# Patient Record
Sex: Male | Born: 1960 | Race: White | Hispanic: No | Marital: Married | Smoking: Former smoker
Health system: Southern US, Community
[De-identification: ages and names within clinical notes are randomized; demographics above are authoritative.]

## PROBLEM LIST (undated history)

## (undated) DIAGNOSIS — J45909 Unspecified asthma, uncomplicated: Secondary | ICD-10-CM

---

## 2015-03-18 ENCOUNTER — Emergency Department (HOSPITAL_COMMUNITY): Payer: 59

## 2015-03-18 ENCOUNTER — Emergency Department (HOSPITAL_COMMUNITY)
Admission: EM | Admit: 2015-03-18 | Discharge: 2015-03-18 | Disposition: A | Discharge status: Home / Self Care | Payer: 59 | Attending: Emergency Medicine | Admitting: Emergency Medicine

## 2015-03-18 ENCOUNTER — Encounter (HOSPITAL_COMMUNITY): Payer: Self-pay

## 2015-03-18 DIAGNOSIS — R339 Retention of urine, unspecified: Secondary | ICD-10-CM | POA: Insufficient documentation

## 2015-03-18 DIAGNOSIS — M545 Low back pain: Secondary | ICD-10-CM | POA: Diagnosis not present

## 2015-03-18 DIAGNOSIS — J45909 Unspecified asthma, uncomplicated: Secondary | ICD-10-CM | POA: Diagnosis not present

## 2015-03-18 DIAGNOSIS — Z792 Long term (current) use of antibiotics: Secondary | ICD-10-CM | POA: Diagnosis not present

## 2015-03-18 DIAGNOSIS — Z87891 Personal history of nicotine dependence: Secondary | ICD-10-CM | POA: Diagnosis not present

## 2015-03-18 DIAGNOSIS — Z7951 Long term (current) use of inhaled steroids: Secondary | ICD-10-CM | POA: Diagnosis not present

## 2015-03-18 DIAGNOSIS — Z79899 Other long term (current) drug therapy: Secondary | ICD-10-CM | POA: Diagnosis not present

## 2015-03-18 DIAGNOSIS — R300 Dysuria: Secondary | ICD-10-CM | POA: Diagnosis present

## 2015-03-18 DIAGNOSIS — Z791 Long term (current) use of non-steroidal anti-inflammatories (NSAID): Secondary | ICD-10-CM | POA: Diagnosis not present

## 2015-03-18 HISTORY — DX: Unspecified asthma, uncomplicated: J45.909

## 2015-03-18 LAB — CBC WITH DIFFERENTIAL/PLATELET
Basophils Absolute: 0 10*3/uL (ref 0.0–0.1)
Basophils Relative: 0 % (ref 0–1)
Eosinophils Absolute: 0 10*3/uL (ref 0.0–0.7)
Eosinophils Relative: 0 % (ref 0–5)
HEMATOCRIT: 45.8 % (ref 39.0–52.0)
HEMOGLOBIN: 15.8 g/dL (ref 13.0–17.0)
LYMPHS PCT: 6 % — AB (ref 12–46)
Lymphs Abs: 0.5 10*3/uL — ABNORMAL LOW (ref 0.7–4.0)
MCH: 30.3 pg (ref 26.0–34.0)
MCHC: 34.5 g/dL (ref 30.0–36.0)
MCV: 87.9 fL (ref 78.0–100.0)
MONO ABS: 0.4 10*3/uL (ref 0.1–1.0)
MONOS PCT: 5 % (ref 3–12)
NEUTROS ABS: 6.4 10*3/uL (ref 1.7–7.7)
Neutrophils Relative %: 89 % — ABNORMAL HIGH (ref 43–77)
Platelets: 126 10*3/uL — ABNORMAL LOW (ref 150–400)
RBC: 5.21 MIL/uL (ref 4.22–5.81)
RDW: 13 % (ref 11.5–15.5)
WBC: 7.2 10*3/uL (ref 4.0–10.5)

## 2015-03-18 LAB — COMPREHENSIVE METABOLIC PANEL
ALBUMIN: 4 g/dL (ref 3.5–5.2)
ALT: 21 U/L (ref 0–53)
AST: 27 U/L (ref 0–37)
Alkaline Phosphatase: 47 U/L (ref 39–117)
Anion gap: 9 (ref 5–15)
BUN: 9 mg/dL (ref 6–23)
CO2: 27 mmol/L (ref 19–32)
Calcium: 9 mg/dL (ref 8.4–10.5)
Chloride: 102 mmol/L (ref 96–112)
Creatinine, Ser: 1.14 mg/dL (ref 0.50–1.35)
GFR calc Af Amer: 83 mL/min — ABNORMAL LOW (ref >90)
GFR, EST NON AFRICAN AMERICAN: 72 mL/min — AB (ref >90)
Glucose, Bld: 107 mg/dL — ABNORMAL HIGH (ref 70–99)
Potassium: 4.3 mmol/L (ref 3.5–5.1)
SODIUM: 138 mmol/L (ref 135–145)
TOTAL PROTEIN: 7.2 g/dL (ref 6.0–8.3)
Total Bilirubin: 1.1 mg/dL (ref 0.3–1.2)

## 2015-03-18 LAB — URINALYSIS, ROUTINE W REFLEX MICROSCOPIC
BILIRUBIN URINE: NEGATIVE
GLUCOSE, UA: NEGATIVE mg/dL
Hgb urine dipstick: NEGATIVE
Ketones, ur: NEGATIVE mg/dL
Leukocytes, UA: NEGATIVE
Nitrite: NEGATIVE
Protein, ur: NEGATIVE mg/dL
Specific Gravity, Urine: 1.006 (ref 1.005–1.030)
UROBILINOGEN UA: 1 mg/dL (ref 0.0–1.0)
pH: 6.5 (ref 5.0–8.0)

## 2015-03-18 LAB — TROPONIN I: Troponin I: <0.03 ng/mL (ref <0.031)

## 2015-03-18 MED ORDER — MORPHINE SULFATE 4 MG/ML IJ SOLN
4.0000 mg | Freq: Once | INTRAMUSCULAR | Status: AC
  Administered 2015-03-18: 4 mg via INTRAVENOUS
  Filled 2015-03-18: qty 1

## 2015-03-18 MED ORDER — ONDANSETRON HCL 4 MG/2ML IJ SOLN
4.0000 mg | Freq: Once | INTRAMUSCULAR | Status: AC
  Administered 2015-03-18: 4 mg via INTRAVENOUS
  Filled 2015-03-18: qty 2

## 2015-03-18 NOTE — ED Notes (Signed)
Staff attempted to insert a 16Fr foley catheter and unsuccessful. EDP notified. Coude at bedside for EDP to possible attempt.

## 2015-03-18 NOTE — ED Provider Notes (Signed)
CSN: [id number]     Arrival date & time 03/18/15  1345 History   First MD Initiated Contact with Patient 03/18/15 1644     Chief Complaint  Patient presents with  . Dysuria     (Consider location/radiation/quality/duration/timing/severity/associated sxs/prior Treatment) HPI Comments: Patient reports pain and pressure with urination and difficulty obtaining his bladder since this morning. Also endorses pain across his low back for the past several days. He saw his PCP yesterday for body aches, chills and fever and was started on azithromycin for pneumonia. A chest x-ray was not done. He states he has been coughing that is improving. He does have a history of asthma. Denies Chest pain or shortness of breath. Denies any change in bowel habits. He denies any history of prostate problems or kidney stones. He denies any blood in his urine. He denies any fever today but had a fever yesterday.  The history is provided by the patient.    Past Medical History  Diagnosis Date  . Asthma    History reviewed. No pertinent past surgical history. History reviewed. No pertinent family history. History  Substance Use Topics  . Smoking status: Former Games developer  . Smokeless tobacco: Not on file  . Alcohol Use: No    Review of Systems  Constitutional: Negative for fever, activity change and appetite change.  HENT: Negative for congestion and rhinorrhea.   Respiratory: Negative for cough, chest tightness and shortness of breath.   Cardiovascular: Negative for chest pain.  Gastrointestinal: Positive for abdominal pain. Negative for nausea and vomiting.  Genitourinary: Positive for dysuria, urgency, flank pain, decreased urine volume and difficulty urinating.  Musculoskeletal: Positive for back pain.  Skin: Negative for pallor.  Neurological: Negative for dizziness, light-headedness, numbness and headaches.  A complete 10 system review of systems was obtained and all systems are negative except as noted  in the HPI and PMH.      Allergies  Review of patient's allergies indicates no known allergies.  Home Medications   Prior to Admission medications   Medication Sig Start Date End Date Taking? Authorizing Provider  azithromycin (ZITHROMAX) 250 MG tablet Take 250-500 mg by mouth daily. Pt to takes 500 mg on day 1 then 250 mg for 4 days after.  Started on 03-17-15   Yes Historical Provider, MD  beclomethasone (QVAR) 40 MCG/ACT inhaler Inhale 1 puff into the lungs 2 (two) times daily.   Yes Historical Provider, MD  celecoxib (CELEBREX) 200 MG capsule Take 200 mg by mouth daily.   Yes Historical Provider, MD  HYDROcodone-homatropine (HYCODAN) 5-1.5 MG/5ML syrup Take 5 mLs by mouth every 6 (six) hours as needed for cough.   Yes Historical Provider, MD  ipratropium-albuterol (DUONEB) 0.5-2.5 (3) MG/3ML SOLN Take 3 mLs by nebulization every 4 (four) hours as needed (wheezing).   Yes Historical Provider, MD  pantoprazole (PROTONIX) 40 MG tablet Take 40 mg by mouth daily.   Yes Historical Provider, MD  predniSONE (DELTASONE) 20 MG tablet Take 20 mg by mouth See admin instructions. Take 1 tablet twice daily for 5 days. Then 1 tablet daily for 5 days then off. Started on 03-17-15   Yes Historical Provider, MD   BP 114/68 mmHg  Pulse 73  Temp(Src) 98 F (36.7 C) (Oral)  Resp 18  Ht  (1.676 m)  Wt 135 lb (61.236 kg)  BMI 21.80 kg/m2  SpO2 99% Physical Exam  Constitutional: He is oriented to person, place, and time. He appears well-developed and well-nourished. No distress.  HENT:  Head: Normocephalic and atraumatic.  Mouth/Throat: Oropharynx is clear and moist. No oropharyngeal exudate.  Eyes: Conjunctivae and EOM are normal. Pupils are equal, round, and reactive to light.  Neck: Normal range of motion. Neck supple.  No meningismus.  Cardiovascular: Normal rate, regular rhythm, normal heart sounds and intact distal pulses.   No murmur heard. Pulmonary/Chest: Effort normal and breath  sounds normal. No respiratory distress.  Abdominal: Soft. He exhibits distension. There is tenderness. There is no rebound and no guarding.  Distended abdomen with palpable bladder  Musculoskeletal: Normal range of motion. He exhibits tenderness. He exhibits no edema.  Paraspinal lumbar tenderness  Neurological: He is alert and oriented to person, place, and time. No cranial nerve deficit. He exhibits normal muscle tone. Coordination normal.  No ataxia on finger to nose bilaterally. No pronator drift. 5/5 strength throughout. CN 2-12 intact. Negative Romberg. Equal grip strength. Sensation intact. Gait is normal.   Skin: Skin is warm.  Psychiatric: He has a normal mood and affect. His behavior is normal.  Nursing note and vitals reviewed.   ED Course  BLADDER CATHETERIZATION Date/Time: 03/18/2015 6:44 PM Performed by: Glynn Octave Authorized by: Glynn Octave Consent: Verbal consent obtained. Risks and benefits: risks, benefits and alternatives were discussed Consent given by: patient Patient understanding: patient states understanding of the procedure being performed Patient consent: the patient's understanding of the procedure matches consent given Procedure consent: procedure consent matches procedure scheduled Relevant documents: relevant documents present and verified Patient identity confirmed: verbally with patient and provided demographic data Indications: urinary retention Local anesthesia used: yes Local anesthetic: topical anesthetic Patient sedated: no Preparation: Patient was prepped and draped in the usual sterile fashion. Catheter insertion: indwelling Catheter type: Foley Catheter size: 14 Fr Complicated insertion: no Altered anatomy: no Bladder irrigation: no Number of attempts: 1 Urine characteristics: clear Patient tolerance: Patient tolerated the procedure well with no immediate complications   (including critical care time) Labs Review Labs  Reviewed  CBC WITH DIFFERENTIAL/PLATELET - Abnormal; Notable for the following:    Platelets 126 (*)    Neutrophils Relative % 89 (*)    Lymphocytes Relative 6 (*)    Lymphs Abs 0.5 (*)    All other components within normal limits  COMPREHENSIVE METABOLIC PANEL - Abnormal; Notable for the following:    Glucose, Bld 107 (*)    GFR calc non Af Amer 72 (*)    GFR calc Af Amer 83 (*)    All other components within normal limits  URINALYSIS, ROUTINE W REFLEX MICROSCOPIC  TROPONIN I    Imaging Review Dg Chest 2 View  03/18/2015   CLINICAL DATA:  54 year old male new with shortness of breath, fever and this urea  EXAM: CHEST  2 VIEW  COMPARISON:  None.  FINDINGS: The lungs are clear and negative for focal airspace consolidation, pulmonary edema or suspicious pulmonary nodule. No pleural effusion or pneumothorax. Cardiac and mediastinal contours are within normal limits. No acute fracture or lytic or blastic osseous lesions. The visualized upper abdominal bowel gas pattern is unremarkable.  IMPRESSION: No active cardiopulmonary disease.   Electronically Signed   By: Malachy Moan M.D.   On: 03/18/2015 20:12     EKG Interpretation   Date/Time:  Wednesday March 18 2015 17:53:10 EDT Ventricular Rate:  83 PR Interval:  170 QRS Duration: 79 QT Interval:  360 QTC Calculation: 423 R Axis:   85 Text Interpretation:  Sinus rhythm Consider left atrial enlargement No  previous ECGs  available Confirmed by Manus GunningANCOUR  MD, Gervase Colberg [phone]([id number]) on  03/18/2015 6:11:10 PM      MDM   Final diagnoses:  Urinary retention   patient with difficulty urinating, lower abdominal pain and back pain. Diagnosed with pneumonia yesterday and started on Zithromax. No chest pain or shortness of breath.  Bladder distended on exam. Greater than 800 mL on bladder scan. Foley catheter will be placed.  Foley catheter placed with drainage of clear urine. Creatinine normal. Urinalysis negative.  Foley catheter will remain  in place. Follow-up with urology. No medicines on patient's list that may cause urinary retention. No evidence of pneumonia on chest x-ray.  Patient to follow-up with urology. Foley catheter will remain in place. Continue medications as prescribed by PCP. Return precautions discussed.  Glynn OctaveStephen Maurya Nethery, MD 03/18/15 [id number]

## 2015-03-18 NOTE — ED Notes (Signed)
414fr Urinary catheter inserted by EDP.

## 2015-03-18 NOTE — ED Notes (Signed)
Changed foley bag to leg bag. Secured the foley tube with a leg strap.

## 2015-03-18 NOTE — Discharge Instructions (Signed)
Acute Urinary Retention Followup with the urologist this week. Return to the ED if you develop new or worsening symptoms. Acute urinary retention is the temporary inability to urinate. This is a common problem in older men. As men age their prostates become larger and block the flow of urine from the bladder. This is usually a problem that has come on gradually.  HOME CARE INSTRUCTIONS If you are sent home with a Foley catheter and a drainage system, you will need to discuss the best course of action with your health care provider. While the catheter is in, maintain a good intake of fluids. Keep the drainage bag emptied and lower than your catheter. This is so that contaminated urine will not flow back into your bladder, which could lead to a urinary tract infection. There are two main types of drainage bags. One is a large bag that usually is used at night. It has a good capacity that will allow you to sleep through the night without having to empty it. The second type is called a leg bag. It has a smaller capacity, so it needs to be emptied more frequently. However, the main advantage is that it can be attached by a leg strap and can go underneath your clothing, allowing you the freedom to move about or leave your home. Only take over-the-counter or prescription medicines for pain, discomfort, or fever as directed by your health care provider.  SEEK MEDICAL CARE IF:  You develop a low-grade fever.  You experience spasms or leakage of urine with the spasms. SEEK IMMEDIATE MEDICAL CARE IF:   You develop chills or fever.  Your catheter stops draining urine.  Your catheter falls out.  You start to develop increased bleeding that does not respond to rest and increased fluid intake. MAKE SURE YOU:  Understand these instructions.  Will watch your condition.  Will get help right away if you are not doing well or get worse. Document Released: 02/20/2001 Document Revised: 11/19/2013 Document  Reviewed: 04/25/2013 [facility] Patient Information 2015 [employer] [place]. This information is not intended to replace advice given to you by your health care provider. Make sure you discuss any questions you have with your health care provider.

## 2015-03-18 NOTE — ED Notes (Signed)
Pt presents with dysuria x 2-3 days with "kidney pain" "for a while".  Pt also reports suprapubic pain.  Pt seen at PCP yesterday, was diagnosed with pneumonia, was told he did not have a kidney infection from urinalysis.

## 2016-03-27 IMAGING — CR DG CHEST 2V
2 series · 2 of 2 positions shown · non-contrast
Comparison: None.

CLINICAL DATA: 54-year-old male new with shortness of breath, fever
and this urea

EXAM:
CHEST  2 VIEW

[w chest lat]
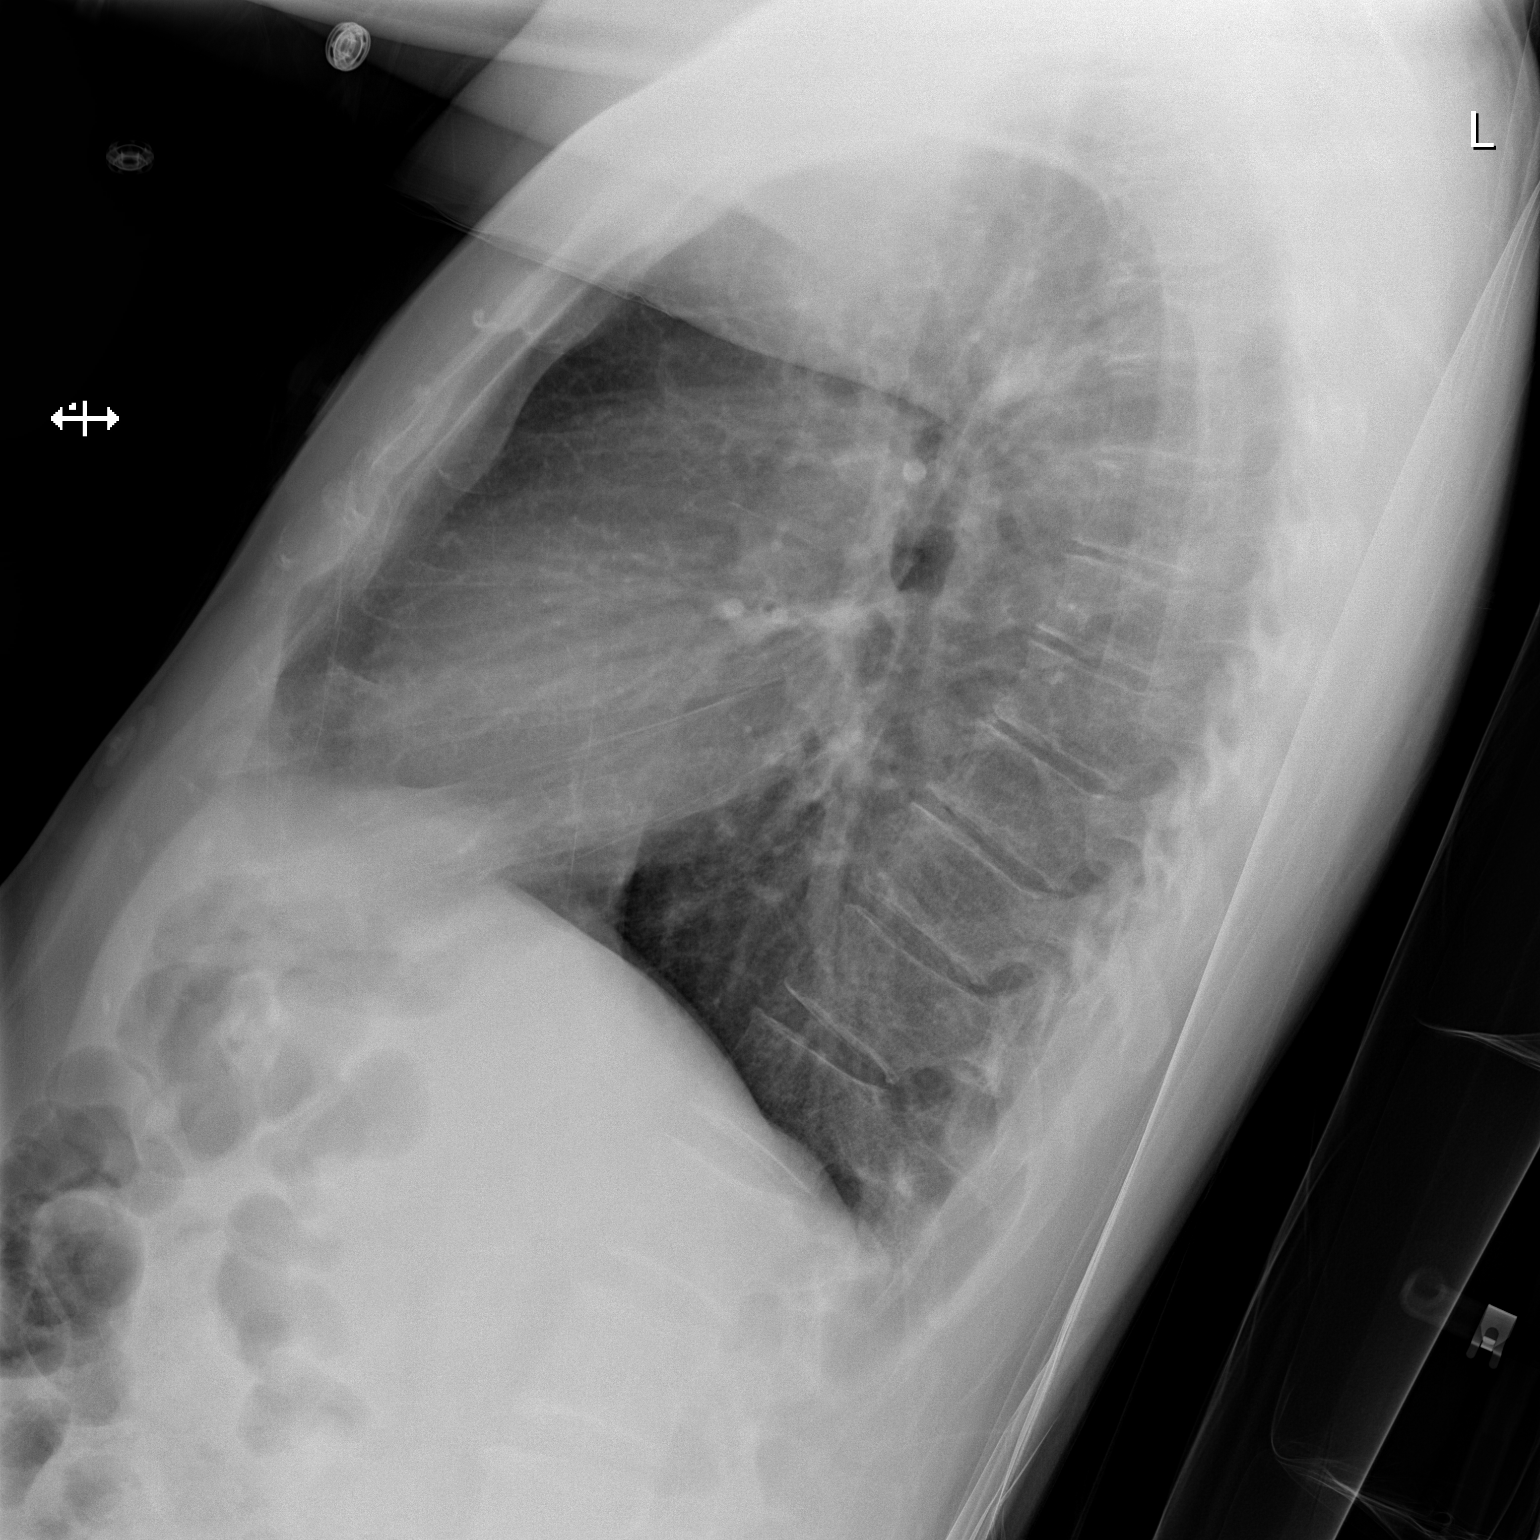

[x chest ap]
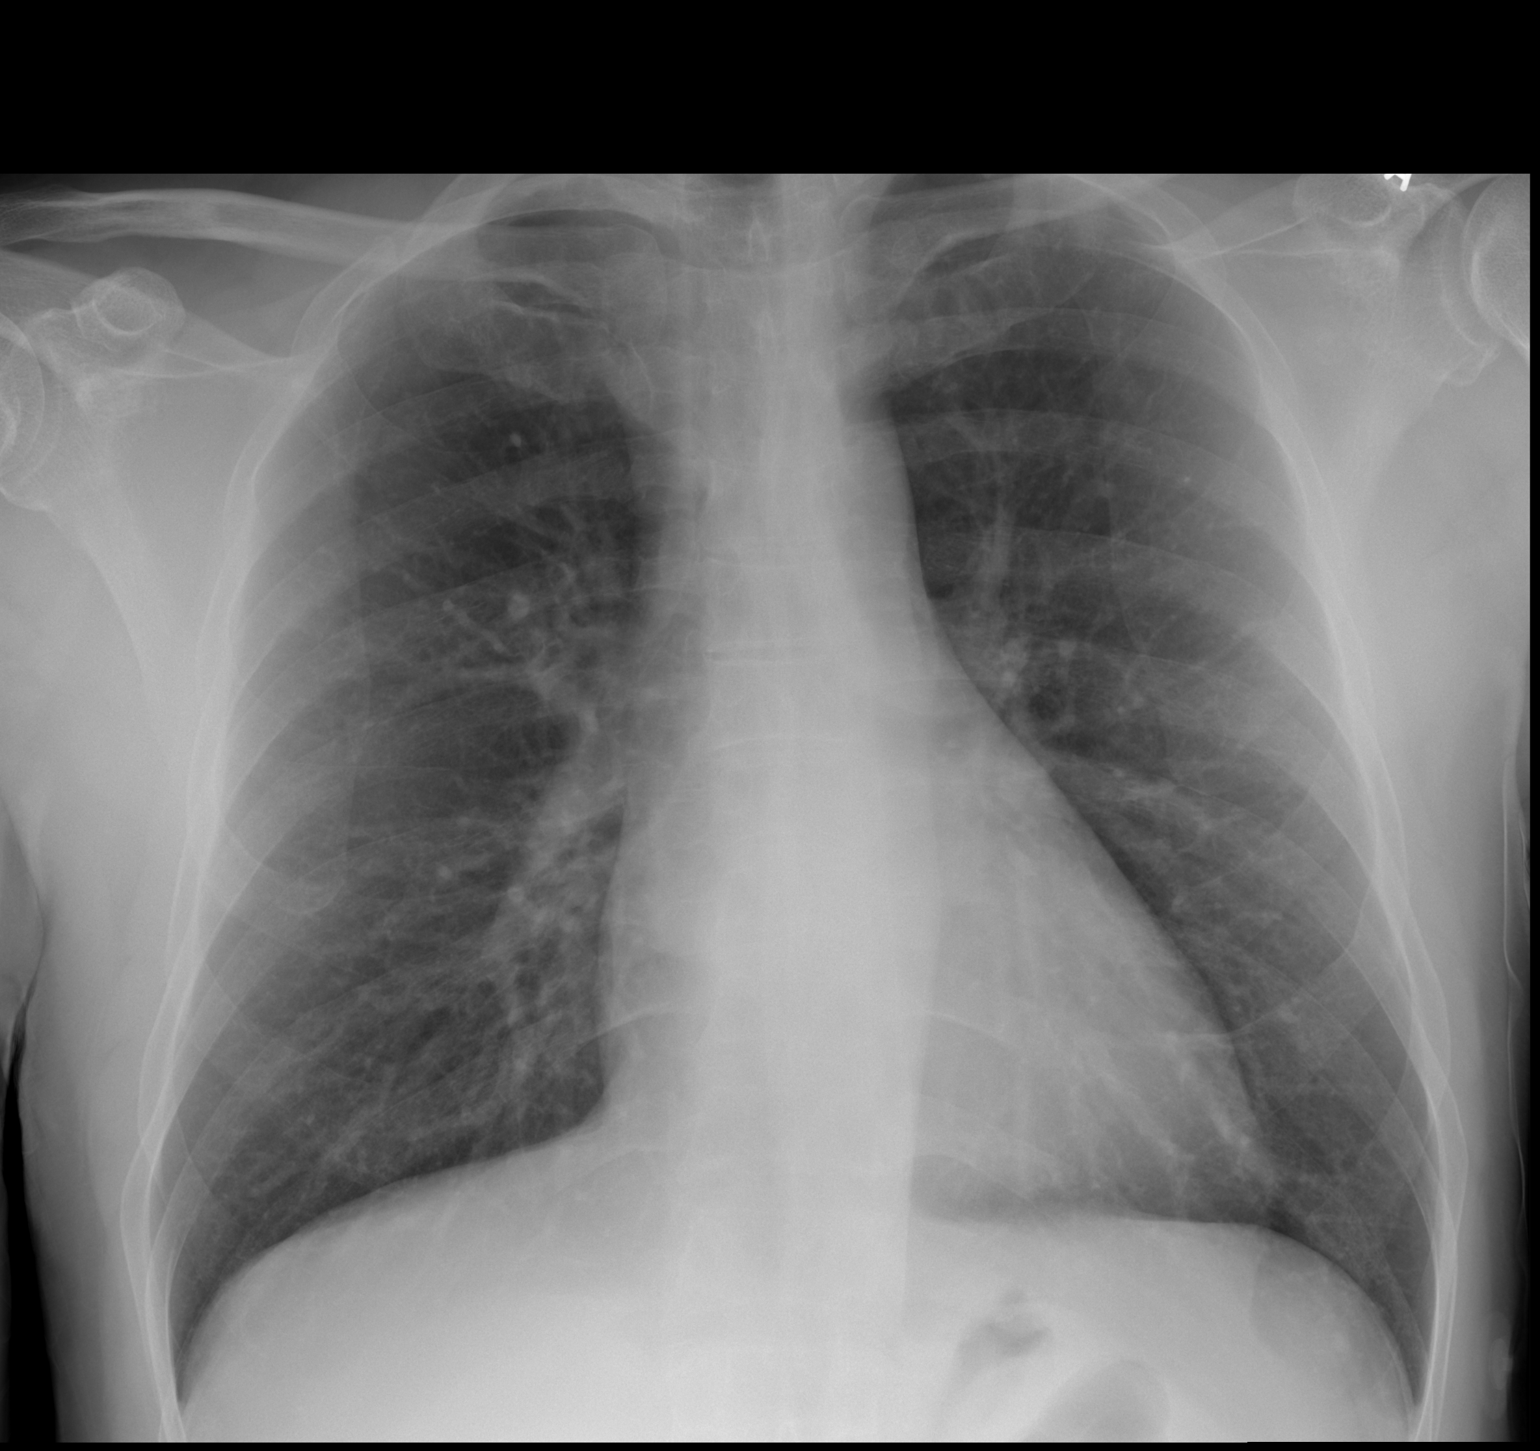

[2 of 2 positions shown; findings below may reference images not displayed]

FINDINGS: The lungs are clear and negative for focal airspace consolidation,
pulmonary edema or suspicious pulmonary nodule. No pleural effusion
or pneumothorax. Cardiac and mediastinal contours are within normal
limits. No acute fracture or lytic or blastic osseous lesions. The
visualized upper abdominal bowel gas pattern is unremarkable.
IMPRESSION: No active cardiopulmonary disease.

## 2019-01-28 ENCOUNTER — Institutional Professional Consult (permissible substitution): Payer: Self-pay | Admitting: Pulmonary Disease

## 2019-03-22 ENCOUNTER — Ambulatory Visit: Payer: BC Managed Care – PPO | Admitting: Internal Medicine

## 2019-03-22 ENCOUNTER — Other Ambulatory Visit: Payer: Self-pay

## 2019-03-22 ENCOUNTER — Ambulatory Visit (INDEPENDENT_AMBULATORY_CARE_PROVIDER_SITE_OTHER): Payer: BC Managed Care – PPO

## 2019-03-22 ENCOUNTER — Encounter: Payer: Self-pay | Admitting: Internal Medicine

## 2019-03-22 VITALS — BP 122/70 | HR 60 | Temp 98.2°F | Ht 65.0 in | Wt 139.0 lb

## 2019-03-22 DIAGNOSIS — J449 Chronic obstructive pulmonary disease, unspecified: Secondary | ICD-10-CM

## 2019-03-22 MED ORDER — GLYCOPYRROLATE-FORMOTEROL 9-4.8 MCG/ACT IN AERO: 2.0000 | INHALATION_SPRAY | Freq: Two times a day (BID) | RESPIRATORY_TRACT | 0 refills | Status: DC

## 2019-03-22 MED ORDER — BECLOMETHASONE DIPROP HFA 40 MCG/ACT IN AERB: 1.0000 | INHALATION_SPRAY | Freq: Two times a day (BID) | RESPIRATORY_TRACT | 0 refills | Status: AC

## 2019-03-22 MED ORDER — GLYCOPYRROLATE-FORMOTEROL 9-4.8 MCG/ACT IN AERO
2.0000 | INHALATION_SPRAY | Freq: Two times a day (BID) | RESPIRATORY_TRACT | 11 refills | Status: DC
Start: 2019-03-22 — End: 2019-04-29

## 2019-03-22 NOTE — Progress Notes (Signed)
Lucianne MussJeff Michelli, male    DOB: 04/29/1961,    MRN: [id number]   Brief patient profile:  [age] yowm quit smoking 2001 with asthma as far back as he can remember typically needed saba before ex as child/adolscent/ young adult and pretty much used saba every day and eventually placed on  multiple maint inhalers "all worked about the same" and had been on advair and then eventually qvar and then breathing got worse since 1st Jan 2020 so referred to pulmonary clinic 03/22/2019 by Mady GemmaKristen Kaplan.   03/22/2019  Pt very sketch on details of care/ names of meds/ timing of events and not sure this hx is reliable so 03/22/2019 req pharmacy records to correlate what he says vs what he fills at pharmacy.     History of Present Illness  03/22/2019  Pulmonary/ 1st office eval/Devon Pretty  Chief Complaint  Patient presents with   Pulmonary Consult    Referred by Mady GemmaKristen Kaplan, PA. Pt c/o increased DOE for the past few months. 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 He gets winded walking up and down a hill.  He uses his proair 3 x per wk on average and he rarely uses neb.   Dyspnea:  MMRC1 = can walk nl pace, flat grade, can't hurry or go uphills or steps s sob   Cough: no Sleep: on back / one pillow  SABA use: only use p winded / during winter feels needs nebulizer most days at bedtime  occ L cp only with exertion not always but only time he does note it is with ex/ never lasts more than a few min when he stops, located L ax and no radiation or assoc n v or diaphoresis  No obvious day to day or daytime variability or assoc excess/ purulent sputum or mucus plugs or hemoptysis or cp or chest tightness, subjective wheeze or overt sinus  symptoms.  Has [facility]oc [facility]   Sleeping as above without nocturnal  or early am exacerbation  of respiratory  c/o's or need for noct saba. Also denies any obvious fluctuation of symptoms with weather or environmental changes or other aggravating or alleviating factors except  as outlined above   No unusual exposure hx or h/o childhood pna or knowledge of premature birth.  Current Allergies, Complete Past Medical History, Past Surgical History, Family History, and Social History were [facility].  ROS  The following are not active complaints unless bolded Hoarseness, sore throat, dysphagia, dental problems, itching, sneezing,  nasal congestion or discharge of excess mucus or purulent secretions, ear ache,   fever, chills, sweats, unintended wt loss or wt gain, classically pleuritic or exertional cp,  orthopnea pnd or arm/hand swelling  or leg swelling, presyncope, palpitations, abdominal pain, anorexia, nausea, vomiting, diarrhea  or change in bowel habits or change in bladder habits, change in stools or change in urine, dysuria, hematuria,  rash, arthralgias, visual complaints, headache, numbness, weakness or ataxia or problems with walking or coordination,  change in mood or  memory.            Past Medical History:  Diagnosis Date   Asthma     Outpatient Medications Prior to Visit  Medication Sig Dispense Refill   Albuterol Sulfate (PROAIR HFA IN) Inhale 2 puffs into the lungs every 6 (six) hours as needed.     beclomethasone (QVAR) 40 MCG/ACT inhaler Inhale 1 puff into the lungs 2 (two) times daily.  celecoxib (CELEBREX) 200 MG capsule Take 200 mg by mouth daily.     ipratropium-albuterol (DUONEB) 0.5-2.5 (3) MG/3ML SOLN Take 3 mLs by nebulization every 4 (four) hours as needed (wheezing).     pantoprazole (PROTONIX) 40 MG tablet Take 40 mg by mouth daily.                            Objective:     BP 122/70 (BP Location: Left Arm, Cuff Size: Normal)    Pulse 60    Temp 98.2 F (36.8 C) (Oral)    Ht 5\' 5"  (1.651 m)    Wt 139 lb (63 kg)    SpO2 97%    BMI 23.13 kg/m   SpO2: 97 %  RA      Hoarse amb wm unusual affect   HEENT: full dentures/ nl  turbinates bilaterally, and oropharynx. Nl  external ear canals without cough reflex   NECK :  without JVD/Nodes/TM/ nl carotid upstrokes bilaterally   LUNGS: no acc muscle use,  Nl contour chest with slightly  distant BS bilaterally without cough on insp or exp maneuvers and no wheezing at all with no saba on day of ov   CV:  RRR  no s3 or murmur or increase in P2, and no edema   ABD:  soft and nontender with nl inspiratory excursion in the supine position. No bruits or organomegaly appreciated, bowel sounds nl  MS:  Nl gait/ ext warm without deformities, calf tenderness, cyanosis or clubbing No obvious joint restrictions   SKIN: warm and dry without lesions    NEURO:  alert, approp, nl sensorium with  no motor or cerebellar deficits apparent.            CXR PA and Lateral:   03/22/2019 :    I personally reviewed images and agree with radiology impression as follows:   Mild hyperinflation without acute airspace disease.        Assessment   COPD GOLD ?  Quit smoking 2001 - asthma as child ? Never outgrew but symptoms worse p quit smoking  - 03/22/2019   Walked RA  2 laps @  approx 282ft each @ fast pace  stopped due to end of study, no sob/ cp or desats - 03/22/2019  After extensive coaching inhaler device,  effectiveness =    75% from a baseline of 50% > try add bevespi to qvar.     Symptoms are   disproportionate to objective findings and not clear to what extent this is actually a pulmonary  problem but pt does appear to have difficult to sort out respiratory symptoms of unknown origin for which  DDX  = almost all start with A and  include Adherence, Ace Inhibitors, Acid Reflux, Active Sinus Disease, Alpha 1 Antitripsin deficiency, Anxiety masquerading as Airways dz,  ABPA,  Allergy(esp in young), Aspiration (esp in elderly), Adverse effects of meds,  Active smoking or Vaping, A bunch of PE's/clot burden (a few small clots can't cause this syndrome unless there is already severe underlying pulm or vascular dz with  poor reserve),  Anemia or thyroid disorder, plus two Bs  = Bronchiectasis and Beta blocker use..and one C= CHF    Adherence is always the initial "prime suspect" and is a multilayered concern that requires a "trust but verify" approach in every patient - starting with knowing how to use medications, especially inhalers, correctly, keeping up with refills and understanding  the fundamental difference between maintenance and prns vs those medications only taken for a very short course and then stopped and not refilled.  - see device teaching - return Adherence is always the initial "prime suspect" and is a multilayered concern that requires a "trust but verify" approach in every patient - starting with knowing how to use medications, especially inhalers, correctly, keeping up with refills and understanding the fundamental difference between maintenance and prns vs those medications only taken for a very short course and then stopped and not refilled. 3   ? Acid (or non-acid) GERD > always difficult to exclude as up to 75% of pts in some series report no assoc GI/ Heartburn symptoms> rec max (24h)  acid suppression and diet restrictions/ reviewed and instructions given in writing.   ? Allergy/ asthma > continue qvar for now as may have ACOS for which there is no standardized regimen/guideline in terms of best approach so it is entirely empirical pending f/u with full pfts off am meds when  COVID - 19 restrictions have been lifted.   - in meantime: -  Advised pt re response to  saba only p he gets winded doesn't prove anything - should rechallenge prior to ex to see if really helps but only if bevespi not working for him  ? Alpha one AT def > needs screening next ov p review what labs have already been obtained by Mady Gemma.   ? Anxiety/deconditioning > usually at the bottom of this list of usual suspects but should be included  on this pt's based on H and P  and may interfere with adherence and also  interpretation of response or lack thereof to symptom management which can be quite subjective.     ? CHF/ cardiac asthma/ IHD  > probably needs cards eval as he does experience fairly typical /reproducible ex cp (not every time he's sob but only has cp with exertion, at least per his hx) .  Will address on return in 2 weeks but advised pt of my concern in case it crescendo's       Total time devoted to counseling  > 50 % of initial 60 min office visit:  review case with pt/ discussion of options/alternatives/See device teaching which extended face to face time for this visit as well as observing portions of amb 02 sat study and  personally creating written customized instructions  in presence of pt  then going over those specific  Instructions directly with the pt including how to use all of the meds but in particular covering each new medication in detail and the difference between the maintenance= "automatic" meds and the prns using an action plan format for the latter (If this problem/symptom => do that organization reading Left to right).  Please see AVS from this visit for a full list of these instructions which I personally wrote for this pt and  are unique to this visit.      Sandrea Hughs, MD 03/22/2019

## 2019-03-22 NOTE — Patient Instructions (Addendum)
Plan A = Automatic = Bevespi Take 2 puffs first thing in am /chase with qvar and then another 2 puffs about 12 hours later.   Work on inhaler technique:  relax and gently blow all the way out then take a nice smooth deep breath back in, triggering the inhaler at same time you start breathing in.  Hold for up to 5 seconds if you can. Blow out thru nose. Rinse and gargle with water when done.   Plan B = Backup Only use your albuterol inhaler as a rescue medication to be used if you can't catch your breath by resting or doing a relaxed purse lip breathing pattern.  - The less you use it, the better it will work when you need it. - Ok to use the inhaler up to 2 puffs  every 4 hours if you must but call for appointment if use goes up over your usual need - Don't leave home without it !!  (think of it like the spare tire for your car)   Plan C = Crisis - only use your  nebulizer if you first try Plan B and it fails to help > ok to use the nebulizer up to every 4 hours but if start needing it regularly call for immediate appointment   Change protonix (pantoprazole) 40 mg Take 30-60 min before first meal of the day   GERD (REFLUX)  is an extremely common cause of respiratory symptoms just like yours , many times with no obvious heartburn at all.    It can be treated with medication, but also with lifestyle changes including elevation of the head of your bed (ideally with 6 -8inch blocks under the headboard of your bed),  Smoking cessation, avoidance of late meals, excessive alcohol, and avoid fatty foods, chocolate, peppermint, colas, red wine, and acidic juices such as orange juice.  NO MINT OR MENTHOL PRODUCTS SO NO COUGH DROPS  USE SUGARLESS CANDY INSTEAD (Jolley ranchers or Stover's or Life Savers) or even ice chips will also do - the key is to swallow to prevent all throat clearing. NO OIL BASED VITAMINS - use powdered substitutes.  Avoid fish oil when coughing.    Please remember to go to the   x-ray department  for your tests - we will call you with the results when they are available     Please schedule a follow up office visit in 2 weeks, sooner if needed  with all medications /inhalers/ solutions in hand so we can verify exactly what you are taking. This includes all medications from all doctors and over the counters

## 2019-03-23 ENCOUNTER — Encounter: Payer: Self-pay | Admitting: Internal Medicine

## 2019-03-23 NOTE — Assessment & Plan Note (Addendum)
Quit smoking 2001 - asthma as child ? Never outgrew but symptoms worse p quit smoking  - 03/22/2019   Walked RA  2 laps @  approx 226ft each @ fast pace  stopped due to end of study, no sob/ cp or desats - 03/22/2019  After extensive coaching inhaler device,  effectiveness =    75% from a baseline of 50% > try add bevespi to qvar.     Symptoms are   disproportionate to objective findings and not clear to what extent this is actually a pulmonary  problem but pt does appear to have difficult to sort out respiratory symptoms of unknown origin for which  DDX  = almost all start with A and  include Adherence, Ace Inhibitors, Acid Reflux, Active Sinus Disease, Alpha 1 Antitripsin deficiency, Anxiety masquerading as Airways dz,  ABPA,  Allergy(esp in young), Aspiration (esp in elderly), Adverse effects of meds,  Active smoking or Vaping, A bunch of PE's/clot burden (a few small clots can't cause this syndrome unless there is already severe underlying pulm or vascular dz with poor reserve),  Anemia or thyroid disorder, plus two Bs  = Bronchiectasis and Beta blocker use..and one C= CHF    Adherence is always the initial "prime suspect" and is a multilayered concern that requires a "trust but verify" approach in every patient - starting with knowing how to use medications, especially inhalers, correctly, keeping up with refills and understanding the fundamental difference between maintenance and prns vs those medications only taken for a very short course and then stopped and not refilled.  - see device teaching - return Adherence is always the initial "prime suspect" and is a multilayered concern that requires a "trust but verify" approach in every patient - starting with knowing how to use medications, especially inhalers, correctly, keeping up with refills and understanding the fundamental difference between maintenance and prns vs those medications only taken for a very short course and then stopped and not  refilled. 3   ? Acid (or non-acid) GERD > always difficult to exclude as up to 75% of pts in some series report no assoc GI/ Heartburn symptoms> rec max (24h)  acid suppression and diet restrictions/ reviewed and instructions given in writing.   ? Allergy/ asthma > continue qvar for now as may have ACOS for which there is no standardized regimen/guideline in terms of best approach so it is entirely empirical pending f/u with full pfts off am meds when  COVID - 19 restrictions have been lifted.   - in meantime: -  Advised pt re response to  saba only p he gets winded doesn't prove anything - should rechallenge prior to ex to see if really helps but only if bevespi not working for him  ? Alpha one AT def > needs screening next ov p review what labs have already been obtained by Mady Gemma.   ? Anxiety/deconditioning > usually at the bottom of this list of usual suspects but should be included  on this pt's based on H and P  and may interfere with adherence and also interpretation of response or lack thereof to symptom management which can be quite subjective.      ? CHF/ cardiac asthma/ IHD  > probably needs cards eval as he does experience fairly typical /reproducible ex cp (not every time he's sob but only has cp with exertion, at least per his hx) .  Will address on return in 2 weeks but advised pt of my  concern in case it crescendo's    Total time devoted to counseling  > 50 % of initial 60 min office visit:  review case with pt/ discussion of options/alternatives/See device teaching which extended face to face time for this visit as well as observing portions of amb 02 sat study and  personally creating written customized instructions  in presence of pt  then going over those specific  Instructions directly with the pt including how to use all of the meds but in particular covering each new medication in detail and the difference between the maintenance= "automatic" meds and the prns using an  action plan format for the latter (If this problem/symptom => do that organization reading Left to right).  Please see AVS from this visit for a full list of these instructions which I personally wrote for this pt and  are unique to this visit.

## 2019-03-25 NOTE — Progress Notes (Signed)
LMTCB

## 2019-04-08 ENCOUNTER — Encounter: Payer: Self-pay | Admitting: Internal Medicine

## 2019-04-08 ENCOUNTER — Other Ambulatory Visit: Payer: Self-pay

## 2019-04-08 ENCOUNTER — Ambulatory Visit: Payer: BLUE CROSS/BLUE SHIELD | Admitting: Internal Medicine

## 2019-04-08 ENCOUNTER — Ambulatory Visit (INDEPENDENT_AMBULATORY_CARE_PROVIDER_SITE_OTHER): Payer: BC Managed Care – PPO | Admitting: Internal Medicine

## 2019-04-08 VITALS — BP 120/64 | HR 63 | Temp 97.9°F | Ht 65.0 in | Wt 142.0 lb

## 2019-04-08 DIAGNOSIS — J449 Chronic obstructive pulmonary disease, unspecified: Secondary | ICD-10-CM

## 2019-04-08 NOTE — Assessment & Plan Note (Signed)
Quit smoking 2001 - asthma as child ? Never outgrew but symptoms worse p quit smoking  - 03/22/2019   Walked RA  2 laps @  approx 22ft each @ fast pace  stopped due to end of study, no sob/ cp or desats - 03/22/2019    try add bevespi to qvar. - 04/08/2019  After extensive coaching inhaler device,  effectiveness =    90%    Clearly better but hasn't really pushed himself "since the virus" and still  Not undertanding how/when to use rescue meds  I spent extra time with pt today reviewing appropriate use of albuterol for prn use on exertion with the following points: 1) saba is for relief of sob that does not improve by walking a slower pace or resting but rather if the pt does not improve after trying this first. 2) If the pt is convinced, as many are, that saba helps recover from activity faster then it's easy to tell if this is the case by re-challenging : ie stop, take the inhaler, then p 5 minutes try the exact same activity (intensity of workload) that just caused the symptoms and see if they are substantially diminished or not after saba 3) if there is an activity that reproducibly causes the symptoms, try the saba 15 min before the activity on alternate days   If in fact the saba really does help, then fine to continue to use it prn but advised may need to look closer at the maintenance regimen being used to achieve better control of airways disease with exertion.    >>>  Will give another sample of bevespi and ask him to then stop it p done and fill if helping and reducing saba need, and if not return here to group.  Advised:  formulary restrictions will be an ongoing challenge for the forseable future and I would be happy to pick an alternative if the pt will first  provide me a list of them -  pt  will need to return here for training for any new device that is required eg dpi vs hfa vs respimat.    In the meantime we can always provide samples so that the patient never runs out of any  needed respiratory medications.     I had an extended discussion with the patient reviewing all relevant studies completed to date and  lasting 15 to 20 minutes of a 25 minute visit    See device teaching which extended face to face time for this visit.  Each maintenance medication was reviewed in detail including emphasizing most importantly the difference between maintenance and prns and under what circumstances the prns are to be triggered using an action plan format that is not reflected in the computer generated alphabetically organized AVS which I have not found useful in most complex patients, especially with respiratory illnesses  Please see AVS for specific instructions unique to this visit that I personally wrote and verbalized to the the pt in detail and then reviewed with pt  by my nurse highlighting any  changes in therapy recommended at today's visit to their plan of care.

## 2019-04-08 NOTE — Progress Notes (Signed)
Andrew Riggs, male    DOB: 08/11/1961,    MRN: [id number]   Brief patient profile:  [age] yowm quit smoking 2001 with asthma as far back as he can remember typically needed saba before ex as child/adolscent/ young adult and pretty much used saba every day and eventually placed on  multiple maint inhalers "all worked about the same" and had been on advair and then eventually qvar and then breathing got worse since 1st Jan 2020 so referred to pulmonary clinic 03/22/2019 by Andrew GemmaKristen Riggs.   03/22/2019  Pt very sketch on details of care/ names of meds/ timing of events and not sure this hx is reliable so 03/22/2019 req pharmacy records to correlate what he says vs what he fills at pharmacy.     History of Present Illness  03/22/2019  Pulmonary/ 1st office eval/Andrew Riggs  Chief Complaint  Patient presents with   Pulmonary Consult    Referred by Andrew GemmaKristen Kaplan, PA. Pt c/o increased DOE for the past few months. 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 He gets winded walking up and down a hill.  He uses his proair 3 x per wk on average and he rarely uses neb.   Dyspnea:  MMRC1 = can walk nl pace, flat grade, can't hurry or go uphills or steps s sob   Cough: no Sleep: on back / one pillow  SABA use: only use p winded / during winter feels needs nebulizer most days at bedtime  occ L cp only with exertion not always but only time he does note it is with ex/ never lasts more than a few min when he stops, located L ax and no radiation or assoc n v or diaphoresis rec Plan A = Automatic = Bevespi Take 2 puffs first thing in am /chase with qvar and then another 2 puffs about 12 hours later.  Work on inhaler technique:  Plan B = Backup Only use your albuterol inhaler  Plan C = Crisis - only use your  nebulizer if you first try Plan B and it fails to help > ok to use the nebulizer up to every 4 hours but if start needing it regularly call for immediate appointment Change protonix (pantoprazole) 40 mg Take 30-60 min  before first meal of the day  GERD diet     04/08/2019  f/u ov/Andrew Riggs re: asthma vs copd vs ACOS maint on qvar and bivespi  Chief Complaint  Patient presents with   Follow-up    Breathing is doing some better. He is using his albuterol inhaler 3 x per wk on average and neb maybe once per wk.  Dyspnea:  Improved but really not pushing himself "since the virus" / no longer any chest tightness  Cough: none  Sleeping: fine flat  SABA use: less hfa but still using neb "when he overdoes it " (not following ABC plan as above)  02: none   No obvious day to day or daytime variability or assoc excess/ purulent sputum or mucus plugs or hemoptysis or cp or chest tightness, subjective wheeze or overt sinus or hb symptoms.   Sleeping as above without nocturnal  or early am exacerbation  of respiratory  c/o's or need for noct saba. Also denies any obvious fluctuation of symptoms with weather or environmental changes or other aggravating or alleviating factors except as outlined above   No unusual exposure hx or h/o childhood pna or knowledge of premature birth.  Current Allergies, Complete Past Medical History, Past Surgical  History, Family History, and Social History were reviewed in [employer] record.  ROS  The following are not active complaints unless bolded Hoarseness, sore throat, dysphagia, dental problems, itching, sneezing,  nasal congestion or discharge of excess mucus or purulent secretions, ear ache,   fever, chills, sweats, unintended wt loss or wt gain, classically pleuritic or exertional cp,  orthopnea pnd or arm/hand swelling  or leg swelling, presyncope, palpitations, abdominal pain, anorexia, nausea, vomiting, diarrhea  or change in bowel habits or change in bladder habits, change in stools or change in urine, dysuria, hematuria,  rash, arthralgias, visual complaints, headache, numbness, weakness or ataxia or problems with walking or coordination,  change in mood or   memory.        Current Meds  Medication Sig   Albuterol Sulfate (PROAIR HFA IN) Inhale 2 puffs into the lungs every 6 (six) hours as needed.   beclomethasone (QVAR REDIHALER) 40 MCG/ACT inhaler Inhale 1 puff into the lungs 2 (two) times daily.   Cal Carb-Mag Hydrox-Simeth (ROLAIDS ADVANCED PO) Take by mouth as needed.   calcium carbonate (TUMS - DOSED IN MG ELEMENTAL CALCIUM) 500 MG chewable tablet Chew 1 tablet by mouth as needed for indigestion or heartburn.   carboxymethylcellulose (REFRESH PLUS) 0.5 % SOLN 1 drop 3 (three) times daily as needed.   celecoxib (CELEBREX) 200 MG capsule Take 200 mg by mouth daily.   fexofenadine (ALLEGRA) 180 MG tablet Take 180 mg by mouth daily.   Glycopyrrolate-Formoterol (BEVESPI AEROSPHERE) 9-4.8 MCG/ACT AERO Inhale 2 puffs into the lungs 2 (two) times a day.   ipratropium-albuterol (DUONEB) 0.5-2.5 (3) MG/3ML SOLN Take 3 mLs by nebulization every 4 (four) hours as needed (wheezing).   pantoprazole (PROTONIX) 40 MG tablet Take 40 mg by mouth daily.             Andrew Riggs, male    DOB: 1961-08-12,    MRN: [id number]   Brief patient profile:  44 yowm quit smoking 2001 with asthma as far back as he can remember typically needed saba before ex as child/adolscent/ young adult and pretty much used saba every day and eventually placed on  multiple maint inhalers "all worked about the same" and had been on advair and then eventually qvar and then breathing got worse since 1st Jan 2020 so referred to pulmonary clinic 04/08/2019 by Andrew Riggs.   04/08/2019  Pt very sketch on details of care/ names of meds/ timing of events and not sure this hx is reliable so 04/08/2019 req pharmacy records to correlate what he says vs what he fills at pharmacy.     History of Present Illness  04/08/2019  Pulmonary/ 1st office eval/Andrew Riggs  Chief Complaint  Patient presents with   Follow-up    Breathing is doing some better. He is using his albuterol inhaler 3 x  per wk on average and neb maybe once per wk.  Dyspnea:  MMRC1 = can walk nl pace, flat grade, can't hurry or go uphills or steps s sob   Cough: no Sleep: on back / one pillow  SABA use: only use p winded / during winter feels needs nebulizer most days at bedtime  occ L cp only with exertion not always but only time he does note it is with ex/ never lasts more than a few min when he stops, located L ax and no radiation or assoc n v or diaphoresis  No obvious day to day or daytime variability or assoc excess/ purulent  sputum or mucus plugs or hemoptysis or cp or chest tightness, subjective wheeze or overt sinus  symptoms.  Has assoc hb takes ppi daily at [employer] [facility] as above without nocturnal  or early am exacerbation  of respiratory  c/o's or need for noct saba. Also denies any obvious fluctuation of symptoms with weather or environmental changes or other aggravating or alleviating factors except as outlined above   No unusual exposure hx or h/o childhood pna or knowledge of premature birth.  Current Allergies, Complete Past Medical History, Past Surgical History, Family History, and Social History were reviewed in [employer] record.  ROS  The following are not active complaints unless bolded Hoarseness, sore throat, dysphagia, dental problems, itching, sneezing,  nasal congestion or discharge of excess mucus or purulent secretions, ear ache,   fever, chills, sweats, unintended wt loss or wt gain, classically pleuritic or exertional cp,  orthopnea pnd or arm/hand swelling  or leg swelling, presyncope, palpitations, abdominal pain, anorexia, nausea, vomiting, diarrhea  or change in bowel habits or change in bladder habits, change in stools or change in urine, dysuria, hematuria,  rash, arthralgias, visual complaints, headache, numbness, weakness or ataxia or problems with walking or coordination,  change in mood or  memory.            Past Medical History:  Diagnosis  Date   Asthma     Outpatient Medications Prior to Visit  Medication Sig Dispense Refill   Albuterol Sulfate (PROAIR HFA IN) Inhale 2 puffs into the lungs every 6 (six) hours as needed.     beclomethasone (QVAR) 40 MCG/ACT inhaler Inhale 1 puff into the lungs 2 (two) times daily.     celecoxib (CELEBREX) 200 MG capsule Take 200 mg by mouth daily.     ipratropium-albuterol (DUONEB) 0.5-2.5 (3) MG/3ML SOLN Take 3 mLs by nebulization every 4 (four) hours as needed (wheezing).     pantoprazole (PROTONIX) 40 MG tablet Take 40 mg by mouth daily.                            Objective:        amb wm nad  Wt Readings from Last 3 Encounters:  04/08/19 142 lb (64.4 kg)  03/22/19 139 lb (63 kg)  03/18/15 135 lb (61.2 kg)     Vital signs reviewed - Note on arrival 02 sats  98% on RA     HEENT: nl dentition / oropharynx. Nl external ear canals without cough reflex -  Mild bilateral non-specific turbinate edema     NECK :  without JVD/Nodes/TM/ nl carotid upstrokes bilaterally   LUNGS: no acc muscle use,  Mild barrel  contour chest wall with bilateral  Distant bs s audible wheeze and  without cough on insp or exp maneuver and mild  Hyperresonant  to  percussion bilaterally     CV:  RRR  no s3 or murmur or increase in P2, and no edema   ABD:  soft and nontender with pos late  insp Hoover's  in the supine position. No bruits or organomegaly appreciated, bowel sounds nl  MS:   Nl gait/  ext warm without deformities, calf tenderness, cyanosis or clubbing No obvious joint restrictions   SKIN: warm and dry without lesions    NEURO:  alert, approp, nl sensorium with  no motor or cerebellar deficits apparent.  Assessment

## 2019-04-08 NOTE — Patient Instructions (Signed)
Plan A = Automatic = Bevespi Take 2 puffs first thing in am /chase with qvar and then another 2 puffs about 12 hours later.     Plan B = Backup Only use your albuterol inhaler as a rescue medication to be used if you can't catch your breath by resting or doing a relaxed purse lip breathing pattern.  - The less you use it, the better it will work when you need it. - Ok to use the inhaler up to 2 puffs  every 4 hours if you must but call for appointment if use goes up over your usual need - Don't leave home without it !!  (think of it like the spare tire for your car)   Plan C = Crisis - only use your  nebulizer if you first try Plan B and it fails to help > ok to use the nebulizer up to every 4 hours but if start needing it regularly call for immediate appointment   Resume your normal walk and if not improved with Bevespi don't fill it, we will do additional work up   If can't fill the bevespi but you like the effect, we'll need to see you back here    Please schedule a follow up visit in 3 months but call sooner if needed  with all medications /inhalers/ solutions in hand so we can verify exactly what you are taking. This includes all medications from all doctors and over the counters

## 2019-04-17 ENCOUNTER — Telehealth: Payer: Self-pay | Admitting: Internal Medicine

## 2019-04-17 NOTE — Telephone Encounter (Signed)
Returned call to spouse Advised that they need to contact insurance and find out what meds are covered on formulary. She will call office back with names of medications.

## 2019-04-17 NOTE — Telephone Encounter (Signed)
Called and spoke with pt's wife regarding Bevespi Bevespi Inhaler is too expensive for pt at this time Per insurance the available inhalers are Anoro Ellipta  And [employer]  MW please advise which inhaler you advise for patient. Thank you

## 2019-04-17 NOTE — Telephone Encounter (Signed)
bevespi should be free with coupon on my desk If any problems call   Give him a sample and   notify [place] that the pharmacy is not honoring the coupon     The number to call is [phone] and give them the pharmacy name and whatever other info they need to fix the problem / honor the zero coupon guarantee.

## 2019-04-17 NOTE — Telephone Encounter (Signed)
Pts wife called to let the doctor know which medicines the insurance will approve  Anoro Ellipta  And [employer] Call it into [facility] and could you call the pt when you call it in please

## 2019-04-17 NOTE — Telephone Encounter (Signed)
LVM for patient to return call regarding MW recommendations below. X1

## 2019-04-18 NOTE — Telephone Encounter (Signed)
Called and spoke with pt's wife Andrew Riggs stating to her the info from MW. Andrew Riggs stated that pt was given a coupon card at last visit but she stated when they took it to the pharmacy to obtain Bevespi, even with the coupon card the pharmacy was telling them that the Bevespi would be around $200. I did provide Andrew Riggs with the phone number that MW gave for them to call to see if they could get the coupon card worked out. Stated to [place] to call office if there were still any issues and she verbalized understanding. Nothing further needed.

## 2019-04-23 ENCOUNTER — Telehealth: Payer: Self-pay | Admitting: Internal Medicine

## 2019-04-23 MED ORDER — GLYCOPYRROLATE-FORMOTEROL 9-4.8 MCG/ACT IN AERO: 2.0000 | INHALATION_SPRAY | Freq: Two times a day (BID) | RESPIRATORY_TRACT | 0 refills | Status: DC

## 2019-04-23 NOTE — Telephone Encounter (Signed)
Attempted to call pt or wife Loura Halt but unable to reach. Left message for them to return call.

## 2019-04-23 NOTE — Telephone Encounter (Signed)
Pt returning call

## 2019-04-23 NOTE — Telephone Encounter (Signed)
Call returned to patient, made aware of MW recommendations. Voiced understanding. Sample placed upfront and wife to pick up and make his appt at time of pick up for inhaler teaching per MW. Nothing further is needed at this time.

## 2019-04-23 NOTE — Telephone Encounter (Signed)
Fine but they are very different devices so unless familiar with respimat or elipta rec:  Give a couple of bevespi samples and make appt for teaching the new inhaler

## 2019-04-23 NOTE — Telephone Encounter (Signed)
Called and spoke with pt's wife Loura Halt in regards to the coupon card that was given to pt for the [facility] and asked her if pt was still not able to use the card. Per leandre, due to pt's insurance being BCBS, the coupon card would not be able to be used as pt has Nurse, learning disability not medicaid/medicare. Leandre even called the number that we gave her to call in regards to the coupon card that was given to pt at last OV.  Since pt is unable to use the coupon card and since pt's insurance will not cover the [facility], they are requesting to switch pt's inhaler to either Anoro or Stiolto as these are covered inhalers by pt's insurance.  Dr. Sherene Sires, please advise on this for pt. Thanks!

## 2019-04-25 NOTE — Telephone Encounter (Signed)
ATC Patient.  LMTCB. 

## 2019-04-26 NOTE — Telephone Encounter (Signed)
Attempted to call pt but unable to reach. Left message for pt to return call. Due to multiple attempts trying to reach pt and not being able to do so, per triage protocol encounter will be closed. 

## 2019-04-29 ENCOUNTER — Other Ambulatory Visit: Payer: Self-pay

## 2019-04-29 ENCOUNTER — Encounter: Payer: Self-pay | Admitting: Internal Medicine

## 2019-04-29 ENCOUNTER — Ambulatory Visit: Payer: BC Managed Care – PPO | Admitting: Internal Medicine

## 2019-04-29 VITALS — BP 110/70 | HR 74 | Temp 97.4°F | Ht 66.0 in | Wt 139.6 lb

## 2019-04-29 DIAGNOSIS — J449 Chronic obstructive pulmonary disease, unspecified: Secondary | ICD-10-CM

## 2019-04-29 MED ORDER — TIOTROPIUM BROMIDE-OLODATEROL 2.5-2.5 MCG/ACT IN AERS: 2.0000 | INHALATION_SPRAY | Freq: Every day | RESPIRATORY_TRACT | 0 refills | Status: DC

## 2019-04-29 MED ORDER — TIOTROPIUM BROMIDE-OLODATEROL 2.5-2.5 MCG/ACT IN AERS
2.0000 | INHALATION_SPRAY | Freq: Every day | RESPIRATORY_TRACT | 11 refills | Status: DC
Start: 2019-04-29 — End: 2019-07-12

## 2019-04-29 NOTE — Patient Instructions (Addendum)
Plan A = Automatic = Stiolto x one puff 1st thing in am and /chase with qvar and then another 2 puffs about 12 hours later. After a week then increase  stiolto  To  2 each am     Plan B = Backup Only use your albuterol inhaler as a rescue medication to be used if you can't catch your breath by resting or doing a relaxed purse lip breathing pattern.  - The less you use it, the better it will work when you need it. - Ok to use the inhaler up to 2 puffs  every 4 hours if you must but call for appointment if use goes up over your usual need - Don't leave home without it !!  (think of it like the spare tire for your car)   Plan C = Crisis - only use your  nebulizer if you first try Plan B and it fails to help > ok to use the nebulizer up to every 4 hours but if start needing it regularly call for immediate appointment  Please remember to go to the lab department   for your tests - we will call you with the results when they are available.       keep your previous appt - call sooner if needed

## 2019-04-29 NOTE — Progress Notes (Signed)
Andrew Riggs, male    DOB: 02-08-1961,    MRN: [id number]   Brief patient profile:  50 yowm quit smoking 2001 with asthma as far back as he can remember typically needed saba before ex as child/adolscent/ young adult and pretty much used saba every day and eventually placed on  multiple maint inhalers "all worked about the same" and had been on advair and then eventually qvar and then breathing got worse since 1st Jan 2020 so referred to pulmonary clinic 03/22/2019 by Mady Gemma.   03/22/2019  Pt very sketch on details of care/ names of meds/ timing of events and not sure this hx is reliable so 03/22/2019 req pharmacy records to correlate what he says vs what he fills at pharmacy.     History of Present Illness  03/22/2019  Pulmonary/ 1st office eval/Andrew Riggs  Chief Complaint  Patient presents with  . Pulmonary Consult    Referred by Mady Gemma, PA. Pt c/o increased DOE for the past few months. 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 He gets winded walking up and down a hill.  He uses his proair 3 x per wk on average and he rarely uses neb.   Dyspnea:  MMRC1 = can walk nl pace, flat grade, can't hurry or go uphills or steps s sob   Cough: no Sleep: on back / one pillow  SABA use: only use p winded / during winter feels needs nebulizer most days at bedtime  occ L cp only with exertion not always but only time he does note it is with ex/ never lasts more than a few min when he stops, located L ax and no radiation or assoc n v or diaphoresis rec Plan A = Automatic = Bevespi Take 2 puffs first thing in am /chase with qvar and then another 2 puffs about 12 hours later.  Work on inhaler technique:  Plan B = Backup Only use your albuterol inhaler  Plan C = Crisis - only use your  nebulizer if you first try Plan B and it fails to help > ok to use the nebulizer up to every 4 hours but if start needing it regularly call for immediate appointment Change protonix (pantoprazole) 40 mg Take 30-60 min  before first meal of the day  GERD diet     04/08/2019  f/u ov/Andrew Riggs re: asthma vs copd vs ACOS maint on qvar and bivespi  Chief Complaint  Patient presents with  . Follow-up    Breathing is doing some better. He is using his albuterol inhaler 3 x per wk on average and neb maybe once per wk.  Dyspnea:  Improved but really not pushing himself "since the virus" / no longer any chest tightness  Cough: none  Sleeping: fine flat  SABA use: less hfa but still using neb "when he overdoes it " (not following ABC plan as above)  rec Plan A = Automatic = Bevespi Take 2 puffs first thing in am /chase with qvar and then another 2 puffs about 12 hours later.    Plan B = Backup Only use your albuterol inhaler as a rescue medication t Plan C = Crisis - only use your  nebulizer if you first try Plan B  Resume your normal walk and if not improved with Bevespi don't fill it, we will do additional work up  Please schedule a follow up visit in 3 months but call sooner if needed  with all medications /inhalers/ solutions in hand so  we can verify exactly what you are taking. This includes all medications from all doctors and over the counters    No obvious day to day or daytime variability or assoc excess/ purulent sputum or mucus plugs or hemoptysis or cp or chest tightness, subjective wheeze or overt sinus or hb symptoms.   Sleeping as above without nocturnal  Or  Current Allergies, Complete Past Medical History, Past Surgical History, Family History, and Social History were         Current Meds  Medication Sig  . Albuterol Sulfate (PROAIR HFA IN) Inhale 2 puffs into the lungs every 6 (six) hours as needed.  . beclomethasone (QVAR REDIHALER) 40 MCG/ACT inhaler Inhale 1 puff into the lungs 2 (two) times daily.  . Cal Carb-Mag Hydrox-Simeth (ROLAIDS ADVANCED PO) Take by mouth as needed.  . calcium carbonate (TUMS - DOSED IN MG ELEMENTAL CALCIUM) 500 MG chewable tablet Chew 1 tablet by mouth as needed for  indigestion or heartburn.  . carboxymethylcellulose (REFRESH PLUS) 0.5 % SOLN 1 drop 3 (three) times daily as needed.  . celecoxib (CELEBREX) 200 MG capsule Take 200 mg by mouth daily.  . fexofenadine (ALLEGRA) 180 MG tablet Take 180 mg by mouth daily.  . Glycopyrrolate-Formoterol (BEVESPI AEROSPHERE) 9-4.8 MCG/ACT AERO Inhale 2 puffs into the lungs 2 (two) times a day.  . ipratropium-albuterol (DUONEB) 0.5-2.5 (3) MG/3ML SOLN Take 3 mLs by nebulization every 4 (four) hours as needed (wheezing).  . pantoprazole (PROTONIX) 40 MG tablet Take 40 mg by mouth daily.             Andrew Riggs, male    DOB: 01-20-1961,    MRN: [id number]   Brief patient profile:  34 yowm quit smoking 2001 with asthma as far back as he can remember typically needed saba before ex as child/adolscent/ young adult and pretty much used saba every day and eventually placed on  multiple maint inhalers "all worked about the same" and had been on advair and then eventually qvar and then breathing got worse since 1st Jan 2020 so referred to pulmonary clinic 04/08/2019 by Mady Gemma.   04/08/2019  Pt very sketch on details of care/ names of meds/ timing of events and not sure this hx is reliable so 04/08/2019     History of Present Illness  04/08/2019  Pulmonary/ 1st office eval/Andrew Riggs  Chief Complaint  Patient presents with  . Follow-up    Breathing is doing some better. He is using his albuterol inhaler 3 x per wk on average and neb maybe once per wk.  Dyspnea:  MMRC1 = can walk nl pace, flat grade, can't hurry or go uphills or steps s sob   Cough: no Sleep: on back / one pillow  SABA use: only use p winded / during winter feels needs nebulizer most days at bedtime  occ L cp only with exertion not always but only time he does note it is with ex/ never lasts more than a few min when he stops, located L ax and no radiation or assoc n v or diaphoresis rec Plan A = Automatic = Bevespi Take 2 puffs first thing in am /chase  with qvar and then another 2 puffs about 12 hours later.    Plan B = Backup Only use your albuterol inhaler as a rescue medication   Plan C = Crisis - only use your  nebulizer if you first try Plan B  Resume your normal walk and if not improved with Bevespi  don't fill it, we will do additional work up  If can't fill the bevespi but you like the effect, we'll need to see you back here  Please schedule a follow up visit in 3 months but call sooner if needed  with all medications /inhalers/ solutions in hand so we can verify exactly what you are taking. This includes all medications from all doctors and over the counters   04/29/2019  f/u ov/Andrew Riggs re: copd GOLD ?  On qvar plus freq saba / did not bring meds Chief Complaint  Patient presents with  . Follow-up    Breathing is unchanged. No new co's. He is using his proair 3-4 x per wk on average.   Dyspnea:  MMRC1 = can walk nl pace, flat grade, can't hurry or go uphills or steps s sob   Cough: none Sleeping: on back 2 pillows SABA use: qod  02: none    No obvious day to day or daytime variability or assoc excess/ purulent sputum or mucus plugs or hemoptysis or cp or chest tightness, subjective wheeze or overt sinus or hb symptoms.   None  without nocturnal  or early am exacerbation  of respiratory  c/o's or need for noct saba. Also denies any obvious fluctuation of symptoms with weather or environmental changes or other aggravating or alleviating factors except as outlined above   No unusual exposure hx or h/o childhood pna/ asthma or knowledge of premature birth.  Current Allergies, Complete Past Medical History, Past Surgical History, Family History, and Social History were reviewed in [employer] record.  ROS  The following are not active complaints unless bolded Hoarseness, sore throat, dysphagia, dental problems, itching, sneezing,  nasal congestion or discharge of excess mucus or purulent secretions, ear ache,    fever, chills, sweats, unintended wt loss or wt gain, classically pleuritic or exertional cp,  orthopnea pnd or arm/hand swelling  or leg swelling, presyncope, palpitations, abdominal pain, anorexia, nausea, vomiting, diarrhea  or change in bowel habits or change in bladder habits, change in stools or change in urine, dysuria, hematuria,  rash, arthralgias, visual complaints, headache, numbness, weakness or ataxia or problems with walking or coordination,  change in mood or  memory.        Current Meds  Medication Sig  . Albuterol Sulfate (PROAIR HFA IN) Inhale 2 puffs into the lungs every 6 (six) hours as needed.  . beclomethasone (QVAR REDIHALER) 40 MCG/ACT inhaler Inhale 1 puff into the lungs 2 (two) times daily.  . Cal Carb-Mag Hydrox-Simeth (ROLAIDS ADVANCED PO) Take by mouth as needed.  . calcium carbonate (TUMS - DOSED IN MG ELEMENTAL CALCIUM) 500 MG chewable tablet Chew 1 tablet by mouth as needed for indigestion or heartburn.  . carboxymethylcellulose (REFRESH PLUS) 0.5 % SOLN 1 drop 3 (three) times daily as needed.  . celecoxib (CELEBREX) 200 MG capsule Take 200 mg by mouth daily.  . fexofenadine (ALLEGRA) 180 MG tablet Take 180 mg by mouth daily.  Marland Kitchen ipratropium-albuterol (DUONEB) 0.5-2.5 (3) MG/3ML SOLN Take 3 mLs by nebulization every 4 (four) hours as needed (wheezing).  . pantoprazole (PROTONIX) 40 MG tablet Take 40 mg by mouth daily.  Marland Kitchen UNABLE TO FIND Med Name: [facility] as needed  .            Objective:       amb slt hoarse wm nad   04/29/2019          139   04/08/19 142 lb (  64.4 kg)  03/22/19 139 lb (63 kg)  03/18/15 135 lb (61.2 kg)     Vital signs reviewed - Note on arrival 02 sats  100% on RA      HEENT: nl dentition / oropharynx. Nl external ear canals without cough reflex -  Mild bilateral non-specific turbinate edema     NECK :  without JVD/Nodes/TM/ nl carotid upstrokes bilaterally   LUNGS: no acc muscle use,  Mild barrel  contour chest wall  with bilateral  Distant bs s audible wheeze and  without cough on insp or exp maneuver and mild  Hyperresonant  to  percussion bilaterally     CV:  RRR  no s3 or murmur or increase in P2, and no edema   ABD:  soft and nontender with pos late insp Hoover's  in the supine position. No bruits or organomegaly appreciated, bowel sounds nl  MS:   Nl gait/  ext warm without deformities, calf tenderness, cyanosis or clubbing No obvious joint restrictions   SKIN: warm and dry without lesions    NEURO:  alert, approp, nl sensorium with  no motor or cerebellar deficits apparent.        Labs ordered 04/29/2019  Alpha one screen        Assessment

## 2019-05-05 LAB — ALPHA-1 ANTITRYPSIN PHENOTYPE: A-1 Antitrypsin, Ser: 139 mg/dL (ref 83–199)

## 2019-05-06 ENCOUNTER — Encounter: Payer: Self-pay | Admitting: Internal Medicine

## 2019-05-06 NOTE — Assessment & Plan Note (Addendum)
Quit smoking 2001 - asthma as child ? Never outgrew but symptoms worse p quit smoking  - 03/22/2019   Walked RA  2 laps @  approx 265ft each @ fast pace  stopped due to end of study, no sob/ cp or desats - 03/22/2019    try add bevespi to qvar > not covered   - Alpha one AT screen  04/29/2019  MM  Level 139  - 04/29/2019  After extensive coaching inhaler device,  effectiveness =    90% with smi > try stiolto 1 pff daily then increase to 2 pffs daily if tolerated   On unusual rx for copd  =  qvar and saba and may do just as well with less need for rx on stiolto 2 pffs daily if tolerates and covered by insurance and if so try simplify rx to just stiolto on return  - if more tendency to AB flares then laba/ics would be better choice but since have not done spirometry at this point due to covid-19 restrictions I'm just try empirical rx for copd here.   Advised:  formulary restrictions will be an ongoing challenge for the forseable future and I would be happy to pick an alternative if the pt will first  provide me a list of them -  pt  will need to return here for training for any new device that is required eg dpi vs hfa vs respimat.    In the meantime we can always provide samples so that the patient never runs out of any needed respiratory medications.    I had an extended discussion with the patient reviewing all relevant studies completed to date and  lasting 15 to 20 minutes of a 25 minute visit    See device teaching which extended face to face time for this visit.  Each maintenance medication was reviewed in detail including emphasizing most importantly the difference between maintenance and prns and under what circumstances the prns are to be triggered using an action plan format that is not reflected in the computer generated alphabetically organized AVS which I have not found useful in most complex patients, especially with respiratory illnesses  Please see AVS for specific instructions unique  to this visit that I personally wrote and verbalized to the the pt in detail and then reviewed with pt  by my nurse highlighting any  changes in therapy recommended at today's visit to their plan of care.

## 2019-07-12 ENCOUNTER — Ambulatory Visit: Payer: BLUE CROSS/BLUE SHIELD | Admitting: Internal Medicine

## 2019-07-12 ENCOUNTER — Ambulatory Visit (INDEPENDENT_AMBULATORY_CARE_PROVIDER_SITE_OTHER): Payer: BC Managed Care – PPO | Admitting: Pulmonary Disease

## 2019-07-12 ENCOUNTER — Encounter: Payer: Self-pay | Admitting: Pulmonary Disease

## 2019-07-12 ENCOUNTER — Other Ambulatory Visit: Payer: Self-pay

## 2019-07-12 DIAGNOSIS — Z87891 Personal history of nicotine dependence: Secondary | ICD-10-CM | POA: Diagnosis not present

## 2019-07-12 DIAGNOSIS — J449 Chronic obstructive pulmonary disease, unspecified: Secondary | ICD-10-CM

## 2019-07-12 MED ORDER — STIOLTO RESPIMAT 2.5-2.5 MCG/ACT IN AERS: 2.0000 | INHALATION_SPRAY | Freq: Every day | RESPIRATORY_TRACT | 0 refills | Status: DC

## 2019-07-12 MED ORDER — STIOLTO RESPIMAT 2.5-2.5 MCG/ACT IN AERS: 2.0000 | INHALATION_SPRAY | Freq: Every day | RESPIRATORY_TRACT | 6 refills | Status: DC

## 2019-07-12 NOTE — Assessment & Plan Note (Signed)
Plan: Resume Stiolto Respimat, prescription sent to pharmacy today Sample of Stiolto Respimat placed upfront for patient Continue Qvar 40 We will schedule pulmonary function testing Can use rescue inhaler every 6 hours as needed

## 2019-07-12 NOTE — Assessment & Plan Note (Signed)
Plan: Continue to not smoke We will schedule for pulmonary function testing

## 2019-07-12 NOTE — Addendum Note (Signed)
Addended by: Valerie Salts on: 07/12/2019 08:52 AM   Modules accepted: Orders

## 2019-07-12 NOTE — Patient Instructions (Addendum)
You were seen today by Coral CeoBrian P Shar Paez, NP  for:   1. COPD GOLD -needs pulmonary function testing  - Pulmonary function test; Future  Stiolto Respimat inhaler >>>2 puffs daily >>>Take this no matter what >>>This is not a rescue inhaler  Continue QVAR  >>>1 puff every 12 hours >>> Take this no matter what >>> This is not a rescue inhaler >>> Rinse your mouth out after use  Only use your albuterol as a rescue medication to be used if you can't catch your breath by resting or doing a relaxed purse lip breathing pattern.  - The less you use it, the better it will work when you need it. - Ok to use up to 2 puffs  every 4 hours if you must but call for immediate appointment if use goes up over your usual need - Don't leave home without it !!  (think of it like the spare tire for your car)   Note your daily symptoms > remember "red flags" for COPD:   >>>Increase in cough >>>increase in sputum production >>>increase in shortness of breath or activity  intolerance.   If you notice these symptoms, please call the office to be seen.   Continue to be as active as you can  Try to increase physical activity daily  2. Former smoker  Continue to not smoke  You will need a flu vaccine in the fall  We need to ensure that you have your pneumonia vaccines up-to-date  Follow Up:    Return in about 2 months (around 09/11/2019), or if symptoms worsen or fail to improve, for Follow up with Dr. Sherene SiresWert, Follow up for PFT.   Please do your part to reduce the spread of COVID-19:      Reduce your risk of any infection  and COVID19 by using the similar precautions used for avoiding the common cold or flu:  Marland Kitchen. Wash your hands often with soap and warm water for at least 20 seconds.  If soap and water are not readily available, use an alcohol-based hand sanitizer with at least 60% alcohol.  . If coughing or sneezing, cover your mouth and nose by coughing or sneezing into the elbow areas of your shirt  or coat, into a tissue or into your sleeve (not your hands). Drinda Butts. WEAR A MASK when in public  . Avoid shaking hands with others and consider head nods or verbal greetings only. . Avoid touching your eyes, nose, or mouth with unwashed hands.  . Avoid close contact with people who are sick. . Avoid places or events with large numbers of people in one location, like concerts or sporting events. . If you have some symptoms but not all symptoms, continue to monitor at home and seek medical attention if your symptoms worsen. . If you are having a medical emergency, call 911.   ADDITIONAL HEALTHCARE OPTIONS FOR PATIENTS  [facility] / e-Visit: [email or web address]         [facility]: [phone]  [facility]: [phone]                   [facility]: [phone]     It is flu season:   >>> Best ways to protect herself from the flu: Receive the yearly flu vaccine, practice good hand hygiene washing with soap and also using hand sanitizer when available, eat a nutritious meals, get adequate rest, hydrate appropriately   Please contact the office if your  symptoms worsen or you have concerns that you are not improving.   Thank you for choosing [facility] for your healthcare, and for allowing us to partner with you on your healthcare journey. I am thankful to be able to provide care to you today.   Elisha HeadlandBrian Trena Dunavan FNP-C     COPD and Physical Activity Chronic obstructive pulmonary disease (COPD) is a long-term (chronic) condition that affects the lungs. COPD is a general term that can be used to describe many different lung problems that cause lung swelling (inflammation) and limit airflow, including chronic bronchitis and emphysema. The main symptom of COPD is shortness of breath, which makes it harder to do even simple tasks. This can also make it harder to exercise and be active. Talk with  your health care provider about treatments to help you breathe better and actions you can take to prevent breathing problems during physical activity. What are the benefits of exercising with COPD? Exercising regularly is an important part of a healthy lifestyle. You can still exercise and do physical activities even though you have COPD. Exercise and physical activity improve your shortness of breath by increasing blood flow (circulation). This causes your heart to pump more oxygen through your body. Moderate exercise can improve your:  Oxygen use.  Energy level.  Shortness of breath.  Strength in your breathing muscles.  Heart health.  Sleep.  Self-esteem and feelings of self-worth.  Depression, stress, and anxiety levels. Exercise can benefit everyone with COPD. The severity of your disease may affect how hard you can exercise, especially at first, but everyone can benefit. Talk with your health care provider about how much exercise is safe for you, and which activities and exercises are safe for you. What actions can I take to prevent breathing problems during physical activity?  Sign up for a pulmonary rehabilitation program. This type of program may include: ? Education about lung diseases. ? Exercise classes that teach you how to exercise and be more active while improving your breathing. This usually involves:  Exercise using your lower extremities, such as a stationary bicycle.  About 30 minutes of exercise, 2 to 5 times per week, for 6 to 12 weeks  Strength training, such as push ups or leg lifts. ? Nutrition education. ? Group classes in which you can talk with others who also have COPD and learn ways to manage stress.  If you use an oxygen tank, you should use it while you exercise. Work with your health care provider to adjust your oxygen for your physical activity. Your resting flow rate is different from your flow rate during physical activity.  While you are  exercising: ? Take slow breaths. ? Pace yourself and do not try to go too fast. ? Purse your lips while breathing out. Pursing your lips is similar to a kissing or whistling position. ? If doing exercise that uses a quick burst of effort, such as weight lifting:  Breathe in before starting the exercise.  Breathe out during the hardest part of the exercise (such as raising the weights). Where to find support You can find support for exercising with COPD from:  Your health care provider.  A pulmonary rehabilitation program.  Your local health department or community health programs.  Support groups, online or in-person. Your health care provider may be able to recommend support groups. Where to find more information You can find more information about exercising with COPD from:  American Lung Association: [place].hulung.org.  COPD Foundation:  https://www.rivera.net/. Contact a health care provider if:  Your symptoms get worse.  You have chest pain.  You have nausea.  You have a fever.  You have trouble talking or catching your breath.  You want to start a new exercise program or a new activity. Summary  COPD is a general term that can be used to describe many different lung problems that cause lung swelling (inflammation) and limit airflow. This includes chronic bronchitis and emphysema.  Exercise and physical activity improve your shortness of breath by increasing blood flow (circulation). This causes your heart to provide more oxygen to your body.  Contact your health care provider before starting any exercise program or new activity. Ask your health care provider what exercises and activities are safe for you. This information is not intended to replace advice given to you by your health care provider. Make sure you discuss any questions you have with your health care provider. Document Released: 12/07/2017 Document Revised: 03/06/2019 Document Reviewed: 12/07/2017 Elsevier Patient  Education  2020 [employer].

## 2019-07-12 NOTE — Progress Notes (Signed)
Virtual Visit via Telephone Note  I connected with Raynor Calcaterra on 07/12/19 at  9:00 AM EDT by telephone and verified that I am speaking with the correct person using two identifiers.  Location: Patient: Home Provider: [facility] - [address], [place], [place], [place] [address]   I discussed the limitations, risks, security and privacy concerns of performing an evaluation and management service by telephone and the availability of in person appointments. I also discussed with the patient that there may be a patient responsible charge related to this service. The patient expressed understanding and agreed to proceed.  Patient consented to consult via telephone: Yes People present and their role in pt care: Pt     History of Present Illness:  58 year old male former smoker followed in our office for suspected COPD  Smoking history: Former smoker.  Quit 2001.  36 pack years. Maintenance: Stiolto, Qvar 40 Patient of Dr. Melvyn Novas  Chief complaint: COPD follow-up   58 year old male former smoker completing a tele-visit with our office today as a follow-up for his suspected COPD.  Patient with significant smoking history when he quit in 2001.  36 pack years in total.  Patient with hyperinflated lungs.  Patient has never had formal pulmonary function testing, this is been delayed due to the COVID-19 pandemic.  Patient is currently prescribed Stiolto Respimat as well as Qvar 40.  He reports that he ran out of his Stiolto Respimat last week and reports that he cannot get another one for 2 weeks.  Patient reports that overall he is been doing well.  He has not needed to use his rescue inhaler since starting maintenance inhalers.  He currently is walking on the weekends with his daughter only walk about half a mile.  He did not have to use his rescue inhaler at all during that time.  MMRC - Breathlessness Score 1 - I get short of breath when hurrying on level ground or walking up a  slight hill    Observations/Objective:  03/18/2015-CBC with differential-eosinophils relative 0, eosinophils absolute 0  04/29/2019-alpha-1 phenotype-139, PI*MM  03/23/2019-chest x-ray-mild hyperinflation without acute airspace disease  Assessment and Plan:  COPD GOLD ?  Plan: Resume Stiolto Respimat, prescription sent to pharmacy today Sample of Stiolto Respimat placed upfront for patient Continue Qvar 40 We will schedule pulmonary function testing Can use rescue inhaler every 6 hours as needed  Former smoker Plan: Continue to not smoke We will schedule for pulmonary function testing   Follow Up Instructions:  Return in about 2 months (around 09/11/2019), or if symptoms worsen or fail to improve, for Follow up with Dr. Melvyn Novas, Follow up for PFT.   I discussed the assessment and treatment plan with the patient. The patient was provided an opportunity to ask questions and all were answered. The patient agreed with the plan and demonstrated an understanding of the instructions.   The patient was advised to call back or seek an in-person evaluation if the symptoms worsen or if the condition fails to improve as anticipated.  I provided 22 minutes of non-face-to-face time during this encounter.   Lauraine Rinne, NP

## 2019-07-14 NOTE — Progress Notes (Signed)
Chart and office note reviewed in detail  > agree with a/p as outlined    

## 2019-11-08 ENCOUNTER — Other Ambulatory Visit (HOSPITAL_COMMUNITY)
Admission: RE | Admit: 2019-11-08 | Discharge: 2019-11-08 | Disposition: A | Discharge status: Home / Self Care | Payer: BC Managed Care – PPO | Source: Ambulatory Visit | Attending: Pulmonary Disease | Admitting: Pulmonary Disease

## 2019-11-08 ENCOUNTER — Other Ambulatory Visit: Payer: Self-pay

## 2019-11-08 DIAGNOSIS — Z20828 Contact with and (suspected) exposure to other viral communicable diseases: Secondary | ICD-10-CM | POA: Insufficient documentation

## 2019-11-08 DIAGNOSIS — Z01812 Encounter for preprocedural laboratory examination: Secondary | ICD-10-CM | POA: Insufficient documentation

## 2019-11-08 LAB — SARS CORONAVIRUS 2 (TAT 6-24 HRS): SARS Coronavirus 2: NEGATIVE

## 2019-11-11 ENCOUNTER — Encounter: Payer: Self-pay | Admitting: Internal Medicine

## 2019-11-11 ENCOUNTER — Other Ambulatory Visit: Payer: Self-pay

## 2019-11-11 ENCOUNTER — Encounter: Payer: Self-pay | Admitting: *Deleted

## 2019-11-11 ENCOUNTER — Ambulatory Visit: Payer: BC Managed Care – PPO | Admitting: Internal Medicine

## 2019-11-11 ENCOUNTER — Ambulatory Visit (INDEPENDENT_AMBULATORY_CARE_PROVIDER_SITE_OTHER): Payer: BC Managed Care – PPO | Admitting: Internal Medicine

## 2019-11-11 DIAGNOSIS — J449 Chronic obstructive pulmonary disease, unspecified: Secondary | ICD-10-CM | POA: Diagnosis not present

## 2019-11-11 DIAGNOSIS — J309 Allergic rhinitis, unspecified: Secondary | ICD-10-CM

## 2019-11-11 LAB — PULMONARY FUNCTION TEST
DL/VA % pred: 99 %
DL/VA: 4.35 ml/min/mmHg/L
DLCO unc % pred: 83 %
DLCO unc: 20.07 ml/min/mmHg
FEF 25-75 Post: 3.31 L/sec
FEF 25-75 Pre: 3.18 L/sec
FEF2575-%Change-Post: 3 %
FEF2575-%Pred-Post: 123 %
FEF2575-%Pred-Pre: 119 %
FEV1-%Change-Post: 1 %
FEV1-%Pred-Post: 85 %
FEV1-%Pred-Pre: 84 %
FEV1-Post: 2.65 L
FEV1-Pre: 2.61 L
FEV1FVC-%Change-Post: 3 %
FEV1FVC-%Pred-Pre: 110 %
FEV6-%Change-Post: -2 %
FEV6-%Pred-Post: 78 %
FEV6-%Pred-Pre: 80 %
FEV6-Post: 3.03 L
FEV6-Pre: 3.1 L
FEV6FVC-%Pred-Post: 104 %
FEV6FVC-%Pred-Pre: 104 %
FVC-%Change-Post: -2 %
FVC-%Pred-Post: 74 %
FVC-%Pred-Pre: 76 %
FVC-Post: 3.03 L
FVC-Pre: 3.1 L
Post FEV1/FVC ratio: 87 %
Post FEV6/FVC ratio: 100 %
Pre FEV1/FVC ratio: 84 %
Pre FEV6/FVC Ratio: 100 %
RV % pred: 73 %
RV: 1.4 L
TLC % pred: 77 %
TLC: 4.61 L

## 2019-11-11 NOTE — Progress Notes (Signed)
Andrew Riggs, male    DOB: 1961/09/07,    MRN: [id number]   Brief patient profile:  66 yowm quit smoking 2001 with asthma as far back as he can remember typically needed saba before ex as child/adolscent/ young adult and pretty much used saba every day and eventually placed on  multiple maint inhalers "all worked about the same" and had been on advair and then eventually qvar and then breathing got worse since 1st Jan 2020 so referred to pulmonary clinic 04/08/2019 by Andrew Riggs.   04/08/2019  Pt very sketch on details of care/ names of meds/ timing of events and not sure this hx is reliable so 04/08/2019    11/11/2019 pfts nl    History of Present Illness  04/08/2019  Pulmonary/ 1st office eval/Andrew Riggs  Chief Complaint  Patient presents with  . Follow-up    Breathing is doing some better. He is using his albuterol inhaler 3 x per wk on average and neb maybe once per wk.  Dyspnea:  MMRC1 = can walk nl pace, flat grade, can't hurry or go uphills or steps s sob   Cough: no Sleep: on back / one pillow  SABA use: only use p winded / during winter feels needs nebulizer most days at bedtime  occ L cp only with exertion not always but only time he does note it is with ex/ never lasts more than a few min when he stops, located L ax and no radiation or assoc n v or diaphoresis rec Plan A = Automatic = Bevespi Take 2 puffs first thing in am /chase with qvar and then another 2 puffs about 12 hours later.    Plan B = Backup Only use your albuterol inhaler as a rescue medication   Plan C = Crisis - only use your  nebulizer if you first try Plan B  Resume your normal walk and if not improved with Bevespi don't fill it, we will do additional work up  If can't fill the bevespi but you like the effect, we'll need to see you back here  Please schedule a follow up visit in 3 months but call sooner if needed  with all medications /inhalers/ solutions in hand so we can verify exactly what you are  taking. This includes all medications from all doctors and over the counters   04/29/2019  f/u ov/Andrew Riggs re: copd GOLD ?  On qvar plus freq saba / did not bring meds Chief Complaint  Patient presents with  . Follow-up    Breathing is unchanged. No new co's. He is using his proair 3-4 x per wk on average.   Dyspnea:  MMRC1 = can walk nl pace, flat grade, can't hurry or go uphills or steps s sob   Cough: none Sleeping: on back 2 pillows SABA use: qod  02: none  rec Plan A = Automatic = Stiolto x one puff 1st thing in am and /chase with qvar and then another 2 puffs about 12 hours later. After a week then increase  stiolto  To  2 each am    Plan B = Backup Only use your albuterol inhaler as a rescue medication  Plan C = Crisis - only use your  nebulizer if you first try Plan B and it fails to help > ok to use the nebulizer up to every 4 hours but if start needing it regularly call for immediate appointment   11/11/2019  f/u ov/Andrew Riggs re:  GOLD 0 copd  Chief Complaint  Patient presents with  . Follow-up    PFT performed today. Pt states he has been doing okay and denies any real complaints.  Dyspnea:  MMRC1 = can walk nl pace, flat grade, can't hurry or go uphills or steps s sob  Cough: with exertion occasionally  Sleeping: 2 pillows  SABA use: only with exertion  / never prechallenges. No longer needing neb  02: none  Pollen season is bad s pollen    No obvious day to day or daytime variability or assoc excess/ purulent sputum or mucus plugs or hemoptysis or cp or chest tightness, subjective wheeze or overt sinus or hb symptoms.   Sleeping  without nocturnal  or early am exacerbation  of respiratory  c/o's or need for noct saba. Also denies any obvious fluctuation of symptoms with weather or environmental changes or other aggravating or alleviating factors except as outlined above   No unusual exposure hx or h/o childhood pna or knowledge of premature birth.  Current Allergies, Complete  Past Medical History, Past Surgical History, Family History, and Social History were reviewed in [facility] electronic medical record.  ROS  The following are not active complaints unless bolded Hoarseness, sore throat, dysphagia, dental problems, itching, sneezing,  nasal congestion or discharge of excess mucus or purulent secretions, ear ache,   fever, chills, sweats, unintended wt loss or wt gain, classically pleuritic or exertional cp,  orthopnea pnd or arm/hand swelling  or leg swelling, presyncope, palpitations, abdominal pain, anorexia, nausea, vomiting, diarrhea  or change in bowel habits or change in bladder habits, change in stools or change in urine, dysuria, hematuria,  rash, arthralgias, visual complaints, headache, numbness, weakness or ataxia or problems with walking or coordination,  change in mood or  memory.        Current Meds  Medication Sig  . Albuterol Sulfate (PROAIR HFA IN) Inhale 2 puffs into the lungs every 6 (six) hours as needed.  . beclomethasone (QVAR REDIHALER) 40 MCG/ACT inhaler Inhale 1 puff into the lungs 2 (two) times daily.  . Cal Carb-Mag Hydrox-Simeth (ROLAIDS ADVANCED PO) Take by mouth as needed.  . calcium carbonate (TUMS - DOSED IN MG ELEMENTAL CALCIUM) 500 MG chewable tablet Chew 1 tablet by mouth as needed for indigestion or heartburn.  . carboxymethylcellulose (REFRESH PLUS) 0.5 % SOLN 1 drop 3 (three) times daily as needed.  . celecoxib (CELEBREX) 200 MG capsule Take 200 mg by mouth daily.  . fexofenadine (ALLEGRA) 180 MG tablet Take 180 mg by mouth daily.  Marland Kitchen. ipratropium-albuterol (DUONEB) 0.5-2.5 (3) MG/3ML SOLN Take 3 mLs by nebulization every 4 (four) hours as needed (wheezing).  . pantoprazole (PROTONIX) 40 MG tablet Take 40 mg by mouth daily.  . Tiotropium Bromide-Olodaterol (STIOLTO RESPIMAT) 2.5-2.5 MCG/ACT AERS Inhale 2 puffs into the lungs daily.  Marland Kitchen. UNABLE TO FIND Med Name: [facility] as needed                      Objective:       amb slt hoarse wm nad    11/11/2019      143 04/29/2019          139   04/08/19 142 lb (64.4 kg)  03/22/19 139 lb (63 kg)  03/18/15 135 lb (61.2 kg)     Vital signs reviewed - Note on arrival 02 sats  100% on RA    HEENT : pt wearing mask not removed  for exam due to covid - 19 concerns.   NECK :  without JVD/Nodes/TM/ nl carotid upstrokes bilaterally   LUNGS: no acc muscle use,   Very Min barrel  contour chest wall with bilateral  slightly decreased bs s audible wheeze and  without cough on insp or exp maneuvers and min  Hyperresonant  to  percussion bilaterally     CV:  RRR  no s3 or murmur or increase in P2, and no edema   ABD:  soft and nontender with neg  insp Hoover's  in the supine position. No bruits or organomegaly appreciated, bowel sounds nl  MS:   Nl gait/  ext warm without deformities, calf tenderness, cyanosis or clubbing No obvious joint restrictions   SKIN: warm and dry without lesions    NEURO:  alert, approp, nl sensorium with  no motor or cerebellar deficits apparent.            I personally reviewed images and agree with radiology impression as follows:  CXR:   03/22/2019 Mild hyperinflation without acute airspace disease     Assessment

## 2019-11-11 NOTE — Progress Notes (Signed)
PFT done today. 

## 2019-11-11 NOTE — Patient Instructions (Addendum)
Qvar 40 Take 2 puffs first thing in am and then another 2 puffs about 12 hours later.   As needed for itching, sneezing, runny nose, take allegra daily   Try off stiolto to see what difference it makes as you do not have copd   Albuterol is a "rescue medication"  for relief of breathlessness  that does not improve by walking a slower pace or resting  for a few minutes (it doesn't really start working for 5 minutes anyway so ok to wait to see if resting helps)  However, if you are convinced, as many are, that albuterol  helps recover from activity faster then it's easy to tell if this is the case by re-challenging : stop, take the inhaler, then 5 minutes later try the exact same activity (intensity of workload) that just caused the symptoms and see if they are better by using the albuterol prior to exertion.  If  there is an activity that reproducibly causes the breathing problem every time you attempt it, try the albuterol  (either inhaler or nebulizer)  15 min before the activity on alternate days to see if there really is a difference and let me know what you find.   If you find that you really do need the albuterol with activity ok to use it short run but I would recommend you return for CPST  - call me to schedule after the holidays

## 2019-11-12 ENCOUNTER — Encounter: Payer: Self-pay | Admitting: Internal Medicine

## 2019-11-12 DIAGNOSIS — J309 Allergic rhinitis, unspecified: Secondary | ICD-10-CM | POA: Insufficient documentation

## 2019-11-12 NOTE — Assessment & Plan Note (Signed)
Uses allegra daily > advised to blow qvar out thru the nose and use allegra one qd prn    I had an extended discussion with the patient reviewing all relevant studies completed to date and  lasting 15 to 20 minutes of a 25 minute summary final office visit    Each maintenance medication was reviewed in detail including most importantly the difference between maintenance and prns and under what circumstances the prns are to be triggered using an action plan format that is not reflected in the computer generated alphabetically organized AVS.     Please see AVS for specific instructions unique to this visit that I personally wrote and verbalized to the the pt in detail and then reviewed with pt  by my nurse highlighting any  changes in therapy recommended at today's visit to their plan of care.

## 2019-11-12 NOTE — Assessment & Plan Note (Addendum)
Quit smoking 2001 - asthma as child ? Never outgrew but symptoms worse p quit smoking  - 03/22/2019   Walked RA  2 laps @  approx 258ft each @ fast pace  stopped due to end of study, no sob/ cp or desats - 03/22/2019    try add bevespi to qvar > not covered  - Alpha one AT screen  04/29/2019  MM  Level 139  - 04/29/2019  After extensive coaching inhaler device,  effectiveness =    90% with smi > try stiolto 1 pff daily then increase to 2 pffs daily if tolerated  - PFT's 11/11/2019 wnl off all rx, f/v loop nl   >>> He does not have any significant copd but may have an element of asthma related to activity (against this is the lack of variability reported with weather changes or environmental differences) and should continue qvar as in past but d/c stiolto since he feels no better on it   I spent extra time with pt today reviewing appropriate use of albuterol for prn use on exertion with the following points: 1) saba is for relief of sob that does not improve by walking a slower pace or resting but rather if the pt does not improve after trying this first. 2) If the pt is convinced, as many are, that saba helps recover from activity faster then it's easy to tell if this is the case by re-challenging : ie stop, take the inhaler, then p 5 minutes try the exact same activity (intensity of workload) that just caused the symptoms and see if they are substantially diminished or not after saba 3) if there is an activity that reproducibly causes the symptoms, try the saba 15 min before the activity on alternate days   If in fact the saba really does help, then fine to continue to use it prn but advised may need to look closer at the maintenance regimen being used to achieve better control of airways disease with exertion.   Pulmonary f/u is prn with cpst with spirometry before and after

## 2019-11-18 ENCOUNTER — Encounter (HOSPITAL_COMMUNITY): Payer: Self-pay | Admitting: Emergency Medicine

## 2019-11-18 ENCOUNTER — Emergency Department (HOSPITAL_COMMUNITY)
Admission: EM | Admit: 2019-11-18 | Discharge: 2019-11-18 | Disposition: A | Discharge status: Home / Self Care | Payer: BC Managed Care – PPO | Attending: Emergency Medicine | Admitting: Emergency Medicine

## 2019-11-18 ENCOUNTER — Other Ambulatory Visit: Payer: Self-pay

## 2019-11-18 DIAGNOSIS — Z79899 Other long term (current) drug therapy: Secondary | ICD-10-CM | POA: Insufficient documentation

## 2019-11-18 DIAGNOSIS — R339 Retention of urine, unspecified: Secondary | ICD-10-CM | POA: Insufficient documentation

## 2019-11-18 DIAGNOSIS — Z87891 Personal history of nicotine dependence: Secondary | ICD-10-CM | POA: Diagnosis not present

## 2019-11-18 DIAGNOSIS — U071 COVID-19: Secondary | ICD-10-CM | POA: Diagnosis not present

## 2019-11-18 DIAGNOSIS — R338 Other retention of urine: Secondary | ICD-10-CM

## 2019-11-18 LAB — BASIC METABOLIC PANEL
Anion gap: 12 (ref 5–15)
BUN: 16 mg/dL (ref 6–20)
CO2: 23 mmol/L (ref 22–32)
Calcium: 8.9 mg/dL (ref 8.9–10.3)
Chloride: 100 mmol/L (ref 98–111)
Creatinine, Ser: 1.14 mg/dL (ref 0.61–1.24)
GFR calc Af Amer: >60 mL/min (ref >60)
GFR calc non Af Amer: >60 mL/min (ref >60)
Glucose, Bld: 109 mg/dL — ABNORMAL HIGH (ref 70–99)
Potassium: 4.1 mmol/L (ref 3.5–5.1)
Sodium: 135 mmol/L (ref 135–145)

## 2019-11-18 LAB — URINALYSIS, ROUTINE W REFLEX MICROSCOPIC
Bilirubin Urine: NEGATIVE
Glucose, UA: 50 mg/dL — AB
Hgb urine dipstick: NEGATIVE
Ketones, ur: NEGATIVE mg/dL
Leukocytes,Ua: NEGATIVE
Nitrite: NEGATIVE
Protein, ur: 30 mg/dL — AB
Specific Gravity, Urine: 1.014 (ref 1.005–1.030)
pH: 7 (ref 5.0–8.0)

## 2019-11-18 LAB — CBC WITH DIFFERENTIAL/PLATELET
Abs Immature Granulocytes: 0.02 10*3/uL (ref 0.00–0.07)
Basophils Absolute: 0 10*3/uL (ref 0.0–0.1)
Basophils Relative: 0 %
Eosinophils Absolute: 0 10*3/uL (ref 0.0–0.5)
Eosinophils Relative: 0 %
HCT: 46.4 % (ref 39.0–52.0)
Hemoglobin: 16 g/dL (ref 13.0–17.0)
Immature Granulocytes: 0 %
Lymphocytes Relative: 13 %
Lymphs Abs: 0.6 10*3/uL — ABNORMAL LOW (ref 0.7–4.0)
MCH: 30 pg (ref 26.0–34.0)
MCHC: 34.5 g/dL (ref 30.0–36.0)
MCV: 87.1 fL (ref 80.0–100.0)
Monocytes Absolute: 0.3 10*3/uL (ref 0.1–1.0)
Monocytes Relative: 6 %
Neutro Abs: 3.6 10*3/uL (ref 1.7–7.7)
Neutrophils Relative %: 81 %
Platelets: 137 10*3/uL — ABNORMAL LOW (ref 150–400)
RBC: 5.33 MIL/uL (ref 4.22–5.81)
RDW: 13.1 % (ref 11.5–15.5)
WBC: 4.5 10*3/uL (ref 4.0–10.5)
nRBC: 0 % (ref 0.0–0.2)

## 2019-11-18 NOTE — ED Triage Notes (Signed)
Patient tested Friday Covid +, now with urinary retention.  Patient has not been able to urinate today.  Patient with mild shortness of breath.

## 2019-11-18 NOTE — ED Provider Notes (Signed)
[facility] Provider Note   CSN: [id number] Arrival date & time: 11/18/19  1912     History Chief Complaint  Patient presents with  . Covid +  . Urinary Retention    Andrew MussJeff Riggs is a 58 y.o. male.  The history is provided by the patient and medical records. No language interpreter was used.       58 year old male with history of asthma presenting for urinary retention.  Patient recent developed URI symptoms and was seen 4 days ago at an urgent care for his symptom.  He was diagnosed with sinusitis, and was prescribed Atrovent, amoxicillin, and prednisone.  Patient had a Covid test that was obtained and came back positive.  Patient report his symptoms did improve however this morning he developed urinary retention.  He endorsed pressure to his bladder region and unable to urinate throughout the day.  Pressure is moderate to severe, feels very uncomfortable with nausea but without vomiting.  No fever or chills no back pain or rectal pain.  He mention having 1 similar episode of urinary retention several years back requiring Foley placement.  He denies any history of prostate cancer and denies any recent instrumentation.  Past Medical History:  Diagnosis Date  . Asthma     Patient Active Problem List   Diagnosis Date Noted  . Mild allergic rhinitis 11/12/2019  . Former smoker 07/12/2019  . COPD GOLD ?  03/22/2019    History reviewed. No pertinent surgical history.     Family History  Problem Relation Age of Onset  . Asthma Daughter     Social History   Tobacco Use  . Smoking status: Former Smoker    Packs/day: 2.00    Years: 18.00    Pack years: 36.00    Types: Cigarettes    Quit date: 11/29/1999    Years since quitting: 19.9  . Smokeless tobacco: Never Used  Substance Use Topics  . Alcohol use: No  . Drug use: No    Home Medications Prior to Admission medications   Medication Sig Start Date End Date Taking? Authorizing  Provider  Albuterol Sulfate (PROAIR HFA IN) Inhale 2 puffs into the lungs every 6 (six) hours as needed.    [provider]  beclomethasone (QVAR REDIHALER) 40 MCG/ACT inhaler Inhale 1 puff into the lungs 2 (two) times daily. 03/22/19   Nyoka CowdenWert, Michael B, MD  Cal Carb-Mag Hydrox-Simeth (ROLAIDS ADVANCED PO) Take by mouth as needed.    [provider]  calcium carbonate (TUMS - DOSED IN MG ELEMENTAL CALCIUM) 500 MG chewable tablet Chew 1 tablet by mouth as needed for indigestion or heartburn.    [provider]  carboxymethylcellulose (REFRESH PLUS) 0.5 % SOLN 1 drop 3 (three) times daily as needed.    [provider]  celecoxib (CELEBREX) 200 MG capsule Take 200 mg by mouth daily.    [provider]  fexofenadine (ALLEGRA) 180 MG tablet Take 180 mg by mouth daily.    [provider]  pantoprazole (PROTONIX) 40 MG tablet Take 40 mg by mouth daily.    [provider]  UNABLE TO FIND Med Name: [facility] as needed    [provider]    Allergies    Patient has no known allergies.  Review of Systems   Review of Systems  All other systems reviewed and are negative.   Physical Exam Updated Vital Signs BP (!) 138/91   Pulse 92   Resp  20   SpO2 100%   Physical Exam Vitals and nursing note reviewed.  Constitutional:      General: He is not in acute distress.    Appearance: He is well-developed.  HENT:     Head: Atraumatic.  Eyes:     Conjunctiva/sclera: Conjunctivae normal.  Cardiovascular:     Rate and Rhythm: Normal rate and regular rhythm.  Pulmonary:     Breath sounds: Rales (Crackles were heard at left lower lung base.) present.  Abdominal:     General: There is distension.     Tenderness: There is abdominal tenderness (Tenderness to suprapubic region with a distended bladder.).  Genitourinary:    Comments: Chaperone present during exam.  Normal circumcised penis free of lesion or rash.  No inguinal  lymphadenopathy or inguinal hernia noted.  Normal testicle with normal lie.  Normal scrotum. Musculoskeletal:        General: Normal range of motion.     Cervical back: Neck supple.  Skin:    Findings: No rash.  Neurological:     Mental Status: He is alert.  Psychiatric:        Mood and Affect: Mood normal.     ED Results / Procedures / Treatments   Labs (all labs ordered are listed, but only abnormal results are displayed) Labs Reviewed  BASIC METABOLIC PANEL - Abnormal; Notable for the following components:      Result Value   Glucose, Bld 109 (*)    All other components within normal limits  CBC WITH DIFFERENTIAL/PLATELET - Abnormal; Notable for the following components:   Platelets 137 (*)    Lymphs Abs 0.6 (*)    All other components within normal limits  URINALYSIS, ROUTINE W REFLEX MICROSCOPIC - Abnormal; Notable for the following components:   Glucose, UA 50 (*)    Protein, ur 30 (*)    Bacteria, UA RARE (*)    All other components within normal limits    EKG None  Radiology No results found.  Procedures Procedures (including critical care time)  Medications Ordered in ED Medications - No data to display  ED Course  I have reviewed the triage vital signs and the nursing notes.  Pertinent labs & imaging results that were available during my care of the patient were reviewed by me and considered in my medical decision making (see chart for details).    MDM Rules/Calculators/A&P                      BP (!) 138/91   Pulse 92   Resp 20   SpO2 100%   Final Clinical Impression(s) / ED Diagnoses Final diagnoses:  Acute urinary retention  COVID-19 virus infection    Rx / DC Orders ED Discharge Orders    None     8:54 PM Patient here with urinary retention.  Was seen at urgent care 4 days ago for cold symptoms and was prescribed Atrovent, amoxicillin, and prednisone.  On exam, he does have tenderness to his suprapubic region with a distended  bladder.  Will perform bladder scan and likely Foley insertion to alleviate pressure.  I suspect the patient may develop urinary retention secondary to taking Atrovent, which is an anticholinergic medication.  Low suspicion for enlarge prostate or malignancy based on story.  9:18 PM Bladder scan showed 661 mL of retained urine.  Foley catheter inserted.  11:27 PM Patient felt much better after receiving Foley catheter.  UA shows no signs of  urinary tract infection.  When reviewing patient's list of medication, I do feel that Atrovent may contribute to his symptoms therefore I encourage patient to avoid this medication meantime.  Encourage patient to follow-up with [facility] in the next several days for further care.  Return precaution discussed.  Patient stable for discharge.   Domenic Moras, PA-C 11/18/19 2327    Hayden Rasmussen, MD 11/19/19 1016

## 2019-11-18 NOTE — Discharge Instructions (Signed)
Your acute urinary retention may be medication induced.  Please avoid using Atrovent as it may contribute to your condition.  Call and follow-up with urologist in the next 2 to 3 days for reevaluation and removal of Foley catheter.  Return if you have any concern.

## 2020-03-31 IMAGING — DX CHEST - 2 VIEW
2 series · 2 of 2 positions shown · non-contrast
Comparison: None.

CLINICAL DATA: Shortness of breath and history of asthma

EXAM:
CHEST - 2 VIEW

[chest pa]
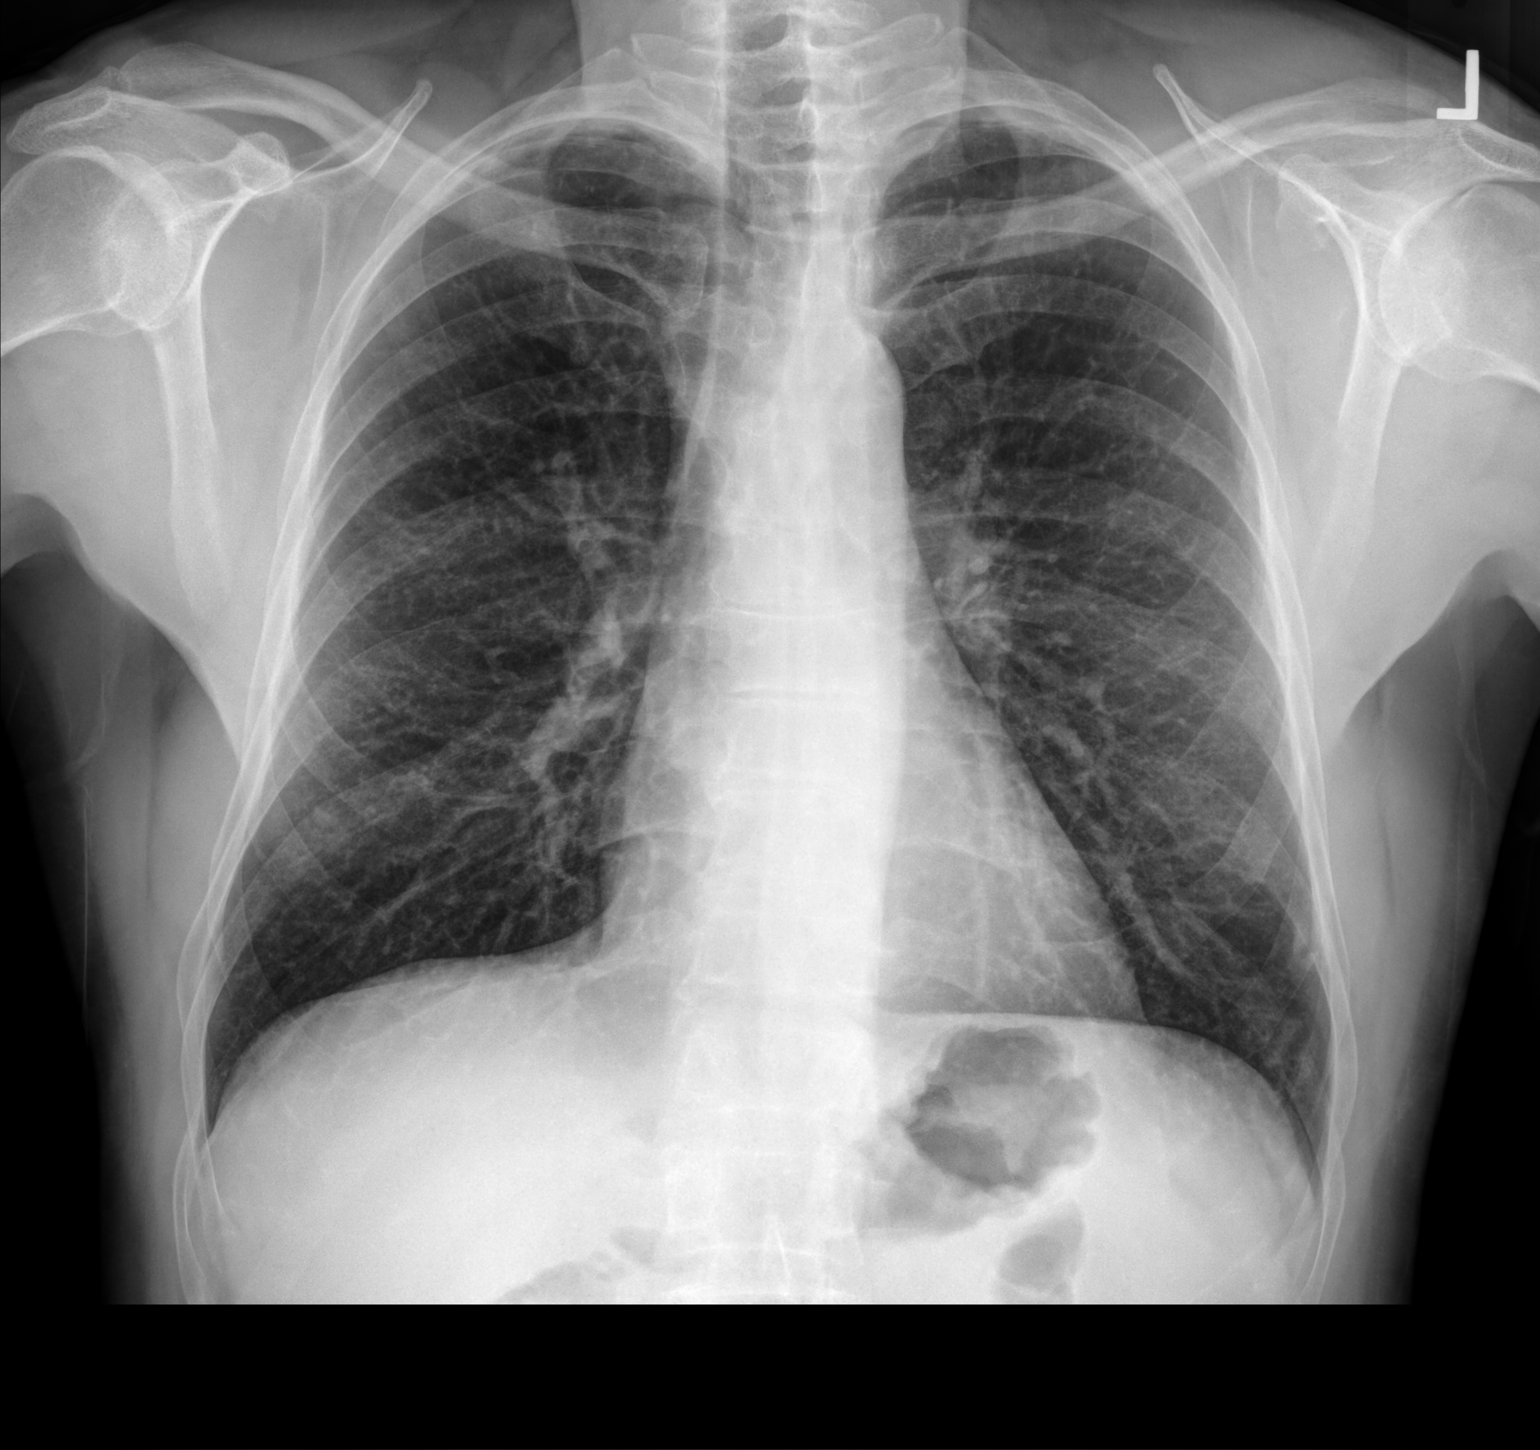

[chest lat]
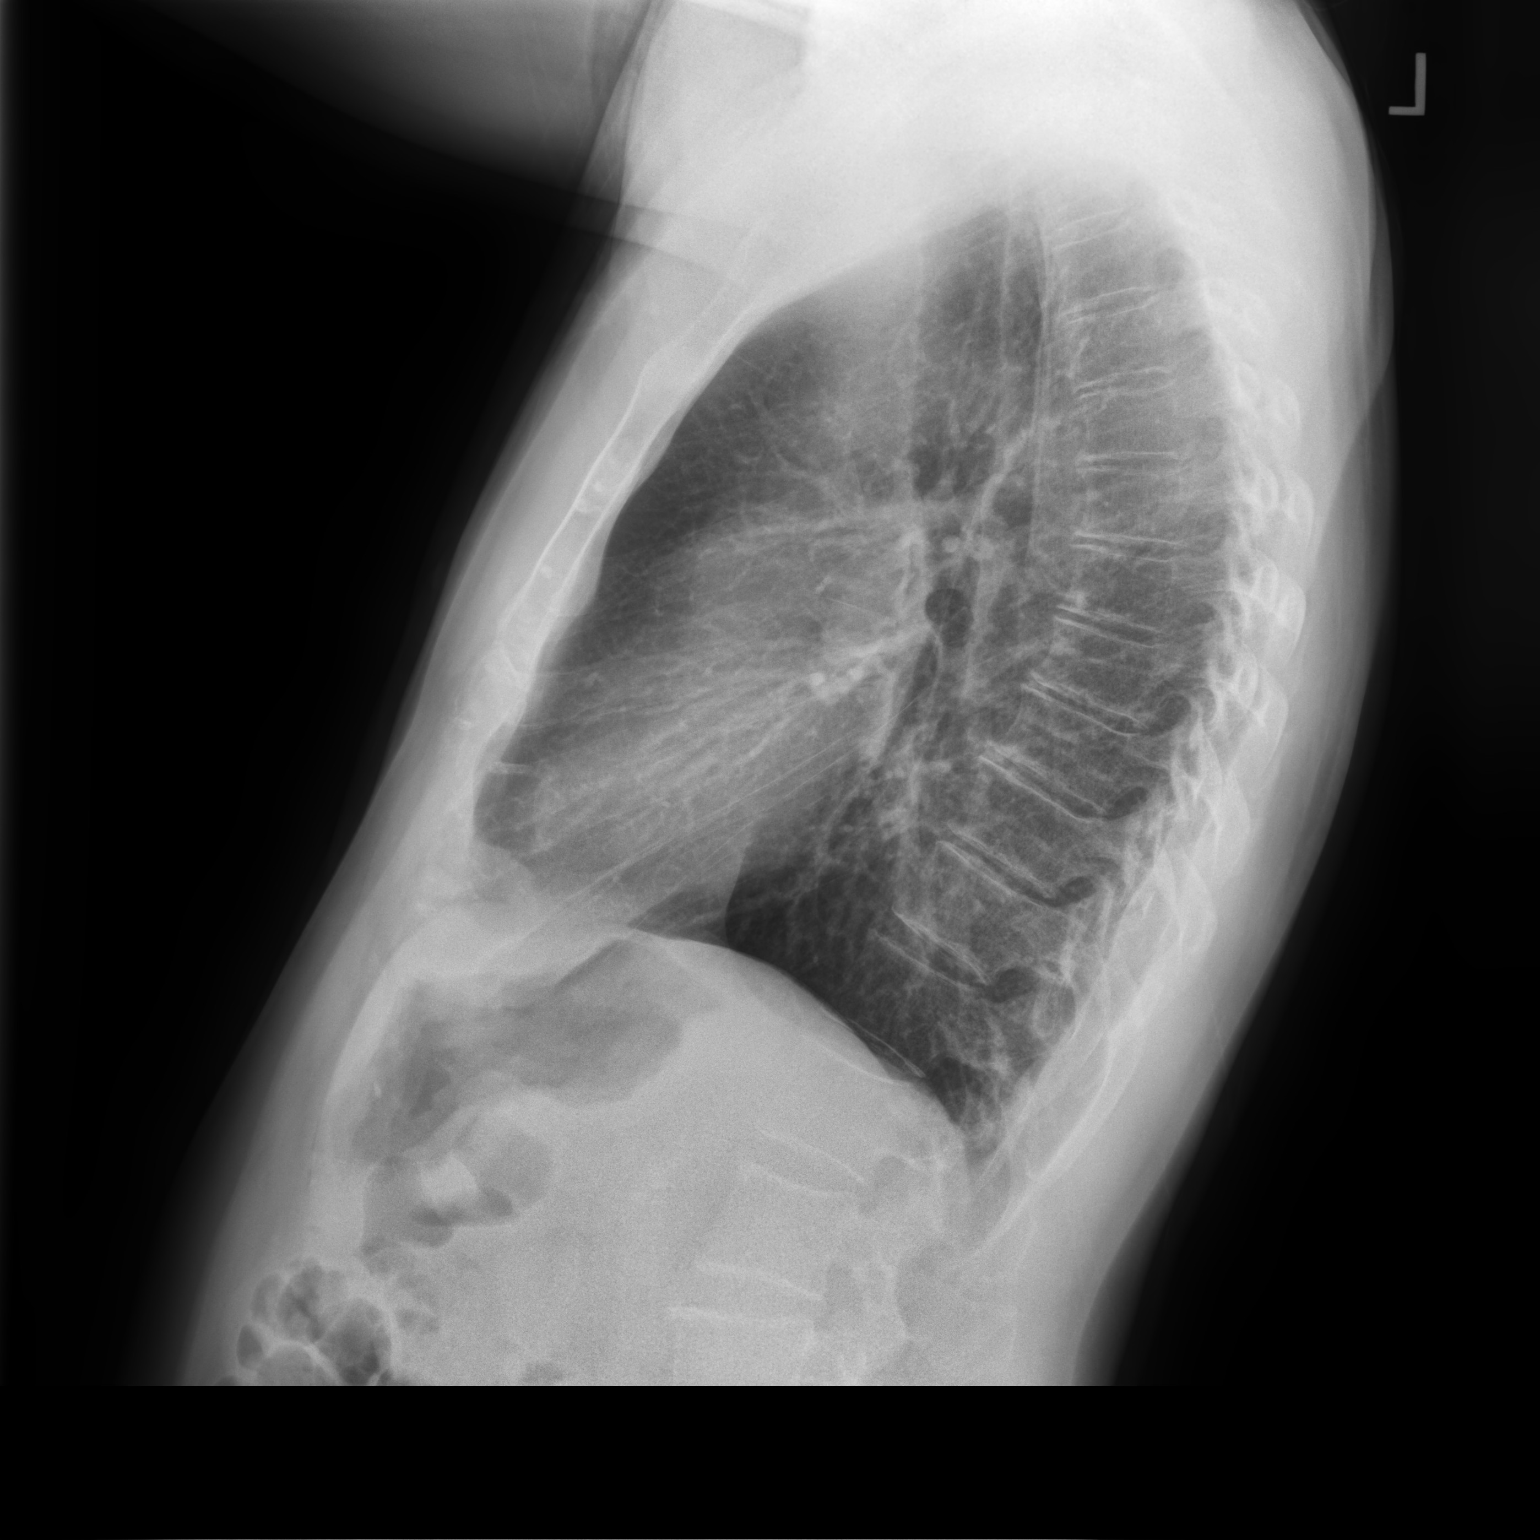

[2 of 2 positions shown; findings below may reference images not displayed]

FINDINGS: Mild hyperinflation. The heart size and mediastinal contours are
within normal limits. Both lungs are clear. The visualized skeletal
structures are unremarkable.
IMPRESSION: Mild hyperinflation without acute airspace disease.
